# Patient Record
Sex: Male | Born: 1980 | Race: Black or African American | Hispanic: No | Marital: Single | Smoking: Current every day smoker
Health system: Southern US, Community
[De-identification: ages and names within clinical notes are randomized; demographics above are authoritative.]

---

## 2002-06-04 ENCOUNTER — Emergency Department (HOSPITAL_COMMUNITY): Admission: EM | Admit: 2002-06-04 | Discharge: 2002-06-04 | Payer: Self-pay | Admitting: Internal Medicine

## 2003-03-17 ENCOUNTER — Emergency Department (HOSPITAL_COMMUNITY): Admission: EM | Admit: 2003-03-17 | Discharge: 2003-03-17 | Payer: Self-pay | Admitting: Emergency Medicine

## 2003-06-17 ENCOUNTER — Emergency Department (HOSPITAL_COMMUNITY): Admission: EM | Admit: 2003-06-17 | Discharge: 2003-06-17 | Payer: Self-pay | Admitting: Emergency Medicine

## 2006-02-21 ENCOUNTER — Emergency Department (HOSPITAL_COMMUNITY): Admission: EM | Admit: 2006-02-21 | Discharge: 2006-02-21 | Payer: Self-pay | Admitting: Emergency Medicine

## 2010-11-11 ENCOUNTER — Encounter: Payer: Self-pay | Admitting: *Deleted

## 2010-11-11 ENCOUNTER — Inpatient Hospital Stay (HOSPITAL_COMMUNITY): Admission: RE | Admit: 2010-11-11 | Payer: Self-pay | Source: Ambulatory Visit | Admitting: Psychiatry

## 2010-11-11 ENCOUNTER — Emergency Department (HOSPITAL_COMMUNITY)
Admission: EM | Admit: 2010-11-11 | Discharge: 2010-11-11 | Disposition: A | Discharge status: Home / Self Care | Payer: Self-pay | Attending: Emergency Medicine | Admitting: Emergency Medicine

## 2010-11-11 DIAGNOSIS — F3289 Other specified depressive episodes: Secondary | ICD-10-CM | POA: Insufficient documentation

## 2010-11-11 DIAGNOSIS — F10929 Alcohol use, unspecified with intoxication, unspecified: Secondary | ICD-10-CM

## 2010-11-11 DIAGNOSIS — F1010 Alcohol abuse, uncomplicated: Secondary | ICD-10-CM | POA: Insufficient documentation

## 2010-11-11 DIAGNOSIS — F17200 Nicotine dependence, unspecified, uncomplicated: Secondary | ICD-10-CM | POA: Insufficient documentation

## 2010-11-11 DIAGNOSIS — R45851 Suicidal ideations: Secondary | ICD-10-CM | POA: Insufficient documentation

## 2010-11-11 DIAGNOSIS — F329 Major depressive disorder, single episode, unspecified: Secondary | ICD-10-CM | POA: Insufficient documentation

## 2010-11-11 LAB — RAPID URINE DRUG SCREEN, HOSP PERFORMED
Barbiturates: NOT DETECTED
Cocaine: NOT DETECTED

## 2010-11-11 LAB — BASIC METABOLIC PANEL
Calcium: 9.7 mg/dL (ref 8.4–10.5)
Creatinine, Ser: 0.99 mg/dL (ref 0.50–1.35)
GFR calc non Af Amer: >90 mL/min (ref >90)
Glucose, Bld: 66 mg/dL — ABNORMAL LOW (ref 70–99)
Sodium: 142 mEq/L (ref 135–145)

## 2010-11-11 LAB — CBC
MCH: 29.7 pg (ref 26.0–34.0)
MCHC: 33 g/dL (ref 30.0–36.0)
MCV: 90.2 fL (ref 78.0–100.0)
Platelets: 217 10*3/uL (ref 150–400)

## 2010-11-11 MED ORDER — LORAZEPAM 1 MG PO TABS: 1.0000 mg | ORAL_TABLET | Freq: Three times a day (TID) | ORAL | Status: DC | PRN

## 2010-11-11 MED ORDER — ONDANSETRON HCL 4 MG PO TABS: 4.0000 mg | ORAL_TABLET | Freq: Three times a day (TID) | ORAL | Status: DC | PRN

## 2010-11-11 MED ORDER — ACETAMINOPHEN 325 MG PO TABS: 650.0000 mg | ORAL_TABLET | ORAL | Status: DC | PRN

## 2010-11-11 MED ORDER — ZOLPIDEM TARTRATE 5 MG PO TABS: 5.0000 mg | ORAL_TABLET | Freq: Every evening | ORAL | Status: DC | PRN

## 2010-11-11 MED ORDER — IBUPROFEN 400 MG PO TABS: 600.0000 mg | ORAL_TABLET | Freq: Three times a day (TID) | ORAL | Status: DC | PRN

## 2010-11-11 NOTE — ED Provider Notes (Signed)
History     CSN: [id number] Arrival date & time: 11/11/2010  4:07 AM  Chief Complaint  Patient presents with  . Psychiatric Evaluation    (Consider location/radiation/quality/duration/timing/severity/associated sxs/prior treatment) The history is provided by the patient.   patient reports history of depression for approximately one year.  His mother died 1 year ago and since then he has continued to be sad and tearful at times.  He presents to the ER today because of increasing suicidal thoughts without a definitive plan.  No history of suicide attempts in the past.  Is not taking any medication for depression.  His father left him at the age of 3 and returned to develop a relationship with him again at age 80.  His father is now dying of liver cancer and is currently living with the patient and has been for approximately 3 months which has exacerbated his symptoms.  He denies drug or alcohol abuse.  He has no plan at this time.  He reports he became very scared when these thoughts became severe today and thus he called 911 because he doesn't want to harm himself.  The symptoms are moderate.  They're not worsened by anything.  Are not relieved by anything.  They're constant   History reviewed. No pertinent past medical history.  History reviewed. No pertinent past surgical history.  History reviewed. No pertinent family history.  History  Substance Use Topics  . Smoking status: Current Everyday Smoker  . Smokeless tobacco: Not on file  . Alcohol Use: Yes      Review of Systems  All other systems reviewed and are negative.    Allergies  Review of patient's allergies indicates no known allergies.  Home Medications  No current outpatient prescriptions on file.  BP 130/88  Pulse 83  Temp(Src) 98.4 F (36.9 C) (Oral)  Resp 20  Ht 6\' 1"  (1.854 m)  Wt 175 lb (79.379 kg)  BMI 23.09 kg/m2  SpO2 99%  Physical Exam  Constitutional: He is oriented to person, place, and time.  He appears well-developed and well-nourished.  HENT:  Head: Normocephalic.  Eyes: EOM are normal.  Neck: Normal range of motion.  Cardiovascular: Normal rate.   Pulmonary/Chest: Effort normal.  Abdominal: Soft.  Musculoskeletal: Normal range of motion.  Neurological: He is alert and oriented to person, place, and time.  Skin: Skin is warm.  Psychiatric:       Tearful with suicidal thoughts    ED Course  Procedures (including critical care time)  Labs Reviewed  BASIC METABOLIC PANEL - Abnormal; Notable for the following:    Glucose, Bld 66 (*)    All other components within normal limits  ETHANOL - Abnormal; Notable for the following:    Alcohol, Ethyl (B) 146 (*)    All other components within normal limits  CBC  URINE RAPID DRUG SCREEN (HOSP PERFORMED)   No results found.   1. Suicidal ideation       MDM  Will contact Behavior[facility] to determine bed availability. Otherwise, if not beds will involve [facility] team for placement  6:50 AM Spoke with [facility] team who will evaluate the pt in the ER. Will hand off his care to the oncoming physician in 10 minutes   Lyanne Co, MD 11/11/10 [phone]
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
## 2010-11-11 NOTE — ED Notes (Signed)
Return call from [place] from [facility] after using cell phone and states she should be here in approx 15 min to evaluate pt. Pt awoke and breakfast tray given. Warm blanket given. Cooperative and pleasant at this time. Sig other at bedside.

## 2010-11-11 NOTE — ED Notes (Addendum)
Pt c/o being stressed out. Pt states his mother passed away last year. Pt states his dad is very sick (tumor on liver) and he takes care of him. Pt states he is suicidal and when asked if he had a plan, he said "no" if he did he would have already "executed it" and we would find him in the morgue. Pt tearful in triage talking about his mother and how much he misses her and how his dad is giving up on him.

## 2010-11-11 NOTE — ED Provider Notes (Signed)
Pt states he is not suicidal, he states he needs to go home because his father is dying from cancer and is going to have surgery in 2 days. Dr Trisha Mangle, telepsych has interviewed patient and agrees patient is safe to be discharged home without medications, to encourage him to avoid drinking and using drugs.  Samara Deist, [facility] was here.   Devoria Albe, MD, [facility]   Ward Givens, MD 11/11/10 [phone]

## 2010-11-11 NOTE — ED Notes (Signed)
[facility] called and pt is on waiting list. Stated they are expecting some discharges over the weekend.

## 2010-11-11 NOTE — ED Notes (Signed)
Pt updated

## 2010-11-11 NOTE — ED Notes (Addendum)
UDS not back-called lab, lab stated did not have a urine, pt's urine taken to lab. [facility] in with pt now.

## 2012-12-18 ENCOUNTER — Emergency Department (HOSPITAL_COMMUNITY)
Admission: EM | Admit: 2012-12-18 | Discharge: 2012-12-18 | Disposition: A | Discharge status: Home / Self Care | Payer: Self-pay | Attending: Emergency Medicine | Admitting: Emergency Medicine

## 2012-12-18 ENCOUNTER — Encounter (HOSPITAL_COMMUNITY): Payer: Self-pay | Admitting: Emergency Medicine

## 2012-12-18 ENCOUNTER — Emergency Department (HOSPITAL_COMMUNITY): Payer: Self-pay

## 2012-12-18 DIAGNOSIS — S62319A Displaced fracture of base of unspecified metacarpal bone, initial encounter for closed fracture: Secondary | ICD-10-CM | POA: Insufficient documentation

## 2012-12-18 DIAGNOSIS — Y929 Unspecified place or not applicable: Secondary | ICD-10-CM | POA: Insufficient documentation

## 2012-12-18 DIAGNOSIS — X500XXA Overexertion from strenuous movement or load, initial encounter: Secondary | ICD-10-CM | POA: Insufficient documentation

## 2012-12-18 DIAGNOSIS — Y9389 Activity, other specified: Secondary | ICD-10-CM | POA: Insufficient documentation

## 2012-12-18 DIAGNOSIS — H209 Unspecified iridocyclitis: Secondary | ICD-10-CM | POA: Insufficient documentation

## 2012-12-18 DIAGNOSIS — S62307A Unspecified fracture of fifth metacarpal bone, left hand, initial encounter for closed fracture: Secondary | ICD-10-CM

## 2012-12-18 DIAGNOSIS — F17200 Nicotine dependence, unspecified, uncomplicated: Secondary | ICD-10-CM | POA: Insufficient documentation

## 2012-12-18 MED ORDER — CYCLOPENTOLATE HCL 2 % OP SOLN
1.0000 [drp] | Freq: Once | OPHTHALMIC | Status: DC
  Filled 2012-12-18: qty 2

## 2012-12-18 MED ORDER — POLYMYXIN B-TRIMETHOPRIM 10000-0.1 UNIT/ML-% OP SOLN: 1.0000 [drp] | OPHTHALMIC | Status: DC

## 2012-12-18 MED ORDER — IBUPROFEN 600 MG PO TABS: 600.0000 mg | ORAL_TABLET | Freq: Four times a day (QID) | ORAL | Status: DC | PRN

## 2012-12-18 MED ORDER — CYCLOPENTOLATE HCL 1 % OP SOLN
1.0000 [drp] | Freq: Once | OPHTHALMIC | Status: AC
  Administered 2012-12-18: 1 [drp] via OPHTHALMIC
  Filled 2012-12-18: qty 2

## 2012-12-18 MED ORDER — OXYCODONE-ACETAMINOPHEN 5-325 MG PO TABS: 1.0000 | ORAL_TABLET | ORAL | Status: DC | PRN

## 2012-12-18 MED ORDER — IBUPROFEN 400 MG PO TABS
600.0000 mg | ORAL_TABLET | Freq: Once | ORAL | Status: AC
  Administered 2012-12-18: 600 mg via ORAL
  Filled 2012-12-18: qty 2

## 2012-12-18 NOTE — ED Notes (Signed)
Pt alert & oriented x4, stable gait. Patient given discharge instructions, paperwork & prescription(s). Patient  instructed to stop at the registration desk to finish any additional paperwork. Patient verbalized understanding. Pt left department w/ no further questions. 
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
## 2012-12-18 NOTE — ED Notes (Signed)
Pt reports blurred vision in left eye.

## 2012-12-18 NOTE — ED Notes (Signed)
Visual acuity done at this time in triage by [name]. [facility]
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
## 2013-03-20 ENCOUNTER — Emergency Department (HOSPITAL_COMMUNITY): Payer: Self-pay

## 2013-03-20 ENCOUNTER — Encounter (HOSPITAL_COMMUNITY): Payer: Self-pay | Admitting: Emergency Medicine

## 2013-03-20 ENCOUNTER — Emergency Department (HOSPITAL_COMMUNITY)
Admission: EM | Admit: 2013-03-20 | Discharge: 2013-03-20 | Disposition: A | Discharge status: Home / Self Care | Payer: Self-pay | Attending: Emergency Medicine | Admitting: Emergency Medicine

## 2013-03-20 DIAGNOSIS — S71109A Unspecified open wound, unspecified thigh, initial encounter: Secondary | ICD-10-CM | POA: Insufficient documentation

## 2013-03-20 DIAGNOSIS — S62308A Unspecified fracture of other metacarpal bone, initial encounter for closed fracture: Secondary | ICD-10-CM

## 2013-03-20 DIAGNOSIS — S71009A Unspecified open wound, unspecified hip, initial encounter: Secondary | ICD-10-CM | POA: Insufficient documentation

## 2013-03-20 DIAGNOSIS — T07XXXA Unspecified multiple injuries, initial encounter: Secondary | ICD-10-CM

## 2013-03-20 DIAGNOSIS — Z23 Encounter for immunization: Secondary | ICD-10-CM | POA: Insufficient documentation

## 2013-03-20 DIAGNOSIS — F17200 Nicotine dependence, unspecified, uncomplicated: Secondary | ICD-10-CM | POA: Insufficient documentation

## 2013-03-20 DIAGNOSIS — Y939 Activity, unspecified: Secondary | ICD-10-CM | POA: Insufficient documentation

## 2013-03-20 DIAGNOSIS — S8990XA Unspecified injury of unspecified lower leg, initial encounter: Secondary | ICD-10-CM | POA: Insufficient documentation

## 2013-03-20 DIAGNOSIS — IMO0002 Reserved for concepts with insufficient information to code with codable children: Secondary | ICD-10-CM | POA: Insufficient documentation

## 2013-03-20 DIAGNOSIS — X500XXA Overexertion from strenuous movement or load, initial encounter: Secondary | ICD-10-CM | POA: Insufficient documentation

## 2013-03-20 DIAGNOSIS — S99929A Unspecified injury of unspecified foot, initial encounter: Secondary | ICD-10-CM

## 2013-03-20 DIAGNOSIS — Y929 Unspecified place or not applicable: Secondary | ICD-10-CM | POA: Insufficient documentation

## 2013-03-20 DIAGNOSIS — S99919A Unspecified injury of unspecified ankle, initial encounter: Secondary | ICD-10-CM

## 2013-03-20 DIAGNOSIS — S51009A Unspecified open wound of unspecified elbow, initial encounter: Secondary | ICD-10-CM | POA: Insufficient documentation

## 2013-03-20 DIAGNOSIS — S21109A Unspecified open wound of unspecified front wall of thorax without penetration into thoracic cavity, initial encounter: Secondary | ICD-10-CM | POA: Insufficient documentation

## 2013-03-20 MED ORDER — TETANUS-DIPHTH-ACELL PERTUSSIS 5-2.5-18.5 LF-MCG/0.5 IM SUSP
0.5000 mL | Freq: Once | INTRAMUSCULAR | Status: AC
  Administered 2013-03-20: 0.5 mL via INTRAMUSCULAR
  Filled 2013-03-20: qty 0.5

## 2013-03-20 MED ORDER — HYDROCODONE-ACETAMINOPHEN 5-325 MG PO TABS: 1.0000 | ORAL_TABLET | Freq: Four times a day (QID) | ORAL | Status: DC | PRN

## 2013-03-20 MED ORDER — BACITRACIN ZINC 500 UNIT/GM EX OINT
TOPICAL_OINTMENT | CUTANEOUS | Status: AC
  Administered 2013-03-20: 15:00:00
  Filled 2013-03-20: qty 0.9
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
## 2013-11-07 ENCOUNTER — Emergency Department (HOSPITAL_COMMUNITY)
Admission: EM | Admit: 2013-11-07 | Discharge: 2013-11-07 | Disposition: A | Discharge status: Home / Self Care | Payer: Self-pay | Attending: Emergency Medicine | Admitting: Emergency Medicine

## 2013-11-07 ENCOUNTER — Encounter (HOSPITAL_COMMUNITY): Payer: Self-pay | Admitting: Emergency Medicine

## 2013-11-07 DIAGNOSIS — X58XXXA Exposure to other specified factors, initial encounter: Secondary | ICD-10-CM | POA: Insufficient documentation

## 2013-11-07 DIAGNOSIS — Y929 Unspecified place or not applicable: Secondary | ICD-10-CM | POA: Insufficient documentation

## 2013-11-07 DIAGNOSIS — Z792 Long term (current) use of antibiotics: Secondary | ICD-10-CM | POA: Insufficient documentation

## 2013-11-07 DIAGNOSIS — Z720 Tobacco use: Secondary | ICD-10-CM | POA: Insufficient documentation

## 2013-11-07 DIAGNOSIS — Y939 Activity, unspecified: Secondary | ICD-10-CM | POA: Insufficient documentation

## 2013-11-07 DIAGNOSIS — T700XXA Otitic barotrauma, initial encounter: Secondary | ICD-10-CM | POA: Insufficient documentation

## 2013-11-07 MED ORDER — IBUPROFEN 800 MG PO TABS
800.0000 mg | ORAL_TABLET | Freq: Once | ORAL | Status: AC
Start: 2013-11-07 — End: 2013-11-07
  Administered 2013-11-07: 800 mg via ORAL
  Filled 2013-11-07: qty 1

## 2013-11-07 MED ORDER — ANTIPYRINE-BENZOCAINE 5.4-1.4 % OT SOLN
3.0000 [drp] | Freq: Once | OTIC | Status: AC
  Administered 2013-11-07: 4 [drp] via OTIC
  Filled 2013-11-07: qty 10

## 2013-11-07 MED ORDER — IBUPROFEN 600 MG PO TABS: 600.0000 mg | ORAL_TABLET | Freq: Four times a day (QID) | ORAL | Status: DC | PRN

## 2013-11-07 NOTE — Discharge Instructions (Signed)
Ear Barotrauma  Ear barotrauma is ear pain or damage caused by a pressure difference between the inside and outside of the eardrum.  CAUSES   Being too close to an explosion or blast.  Receiving a blow to your ear.  Coming to the surface too rapidly after scuba diving.  Flying in an airplane.  Going to high altitudes within 24 hours after diving.  Undergoing rapid depressurization after working in a pressurized chamber. SYMPTOMS   Dizziness that feels like a spinning, rocking, or tumbling sensation.  Loss of balance.  Nausea.  Ringing in your ear(s) (tinnitus).  Ear pain.  Bleeding from the ear(s).  Sudden partial or complete loss of hearing.  Headache. DIAGNOSIS  Your caregiver may know what is wrong based on your history. Your caregiver may also use blood work, X-rays, and other tests to make sure that other injuries did not happen. This may be more important if your injury happened while scuba diving or if you traveled to high altitude after diving. TREATMENT  Treatment depends on how bad the injury to your ear(s) is. For example, if there is a risk for infection, antibiotic medicine may be recommended. Your caregiver will make these decisions based on what was found during your exam. With severe injuries, you may need to see a specialist. HOME CARE INSTRUCTIONS   Do not travel to high altitudes, work in a pressurized cabin or room, or scuba dive until your caregiver approves.  Keep your ears dry. Use the corner of a towel to get water out of the ears after showering or bathing.  Only take over-the-counter or prescription medicines for pain, discomfort, or fever as directed by your caregiver.  See your caregiver as recommended. SEEK MEDICAL CARE IF:   You have pain that is not relieved by medicine or is getting worse.  You have a fever.  You have any new or unusual discharge from the ear.  The outer ear becomes red or swollen or there is swelling behind your  earlobe.  You have problems that may be related to the medicine you are taking. MAKE SURE YOU:   Understand these instructions.  Will watch your condition.  Will get help right away if you are not doing well or get worse. Document Released: 12/18/2005 Document Revised: 04/09/2011 Document Reviewed: 01/03/2007 [facility] Patient Information 2015 [employer], [place]. This information is not intended to replace advice given to you by your health care provider. Make sure you discuss any questions you have with your health care provider.   You may apply the pain drops in your left ear, then lie with that ear up for 5 minutes to allow it to soak in, 4 drops every 4 hours if needed for pain.

## 2013-11-07 NOTE — ED Notes (Signed)
Left ear pain since yesterday
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
## 2013-11-10 NOTE — ED Provider Notes (Signed)
Medical screening examination/treatment/procedure(s) were performed by non-physician practitioner and as supervising physician I was immediately available for consultation/collaboration.   EKG Interpretation None        Naidelyn Parrella L Lyndsie Wallman, MD 11/10/13 1428 

## 2014-01-08 ENCOUNTER — Encounter (HOSPITAL_COMMUNITY): Payer: Self-pay | Admitting: Oncology

## 2014-01-08 ENCOUNTER — Emergency Department (HOSPITAL_COMMUNITY): Payer: Self-pay

## 2014-01-08 ENCOUNTER — Observation Stay (HOSPITAL_COMMUNITY)
Admission: EM | Admit: 2014-01-08 | Discharge: 2014-01-09 | Disposition: A | Discharge status: Home / Self Care | Payer: Self-pay | Attending: General Surgery | Admitting: General Surgery

## 2014-01-08 DIAGNOSIS — S21112A Laceration without foreign body of left front wall of thorax without penetration into thoracic cavity, initial encounter: Principal | ICD-10-CM | POA: Insufficient documentation

## 2014-01-08 DIAGNOSIS — T797XXA Traumatic subcutaneous emphysema, initial encounter: Secondary | ICD-10-CM | POA: Insufficient documentation

## 2014-01-08 DIAGNOSIS — F10129 Alcohol abuse with intoxication, unspecified: Secondary | ICD-10-CM | POA: Insufficient documentation

## 2014-01-08 DIAGNOSIS — S41012A Laceration without foreign body of left shoulder, initial encounter: Secondary | ICD-10-CM | POA: Insufficient documentation

## 2014-01-08 DIAGNOSIS — Y998 Other external cause status: Secondary | ICD-10-CM | POA: Insufficient documentation

## 2014-01-08 DIAGNOSIS — F17210 Nicotine dependence, cigarettes, uncomplicated: Secondary | ICD-10-CM | POA: Insufficient documentation

## 2014-01-08 DIAGNOSIS — S31119A Laceration without foreign body of abdominal wall, unspecified quadrant without penetration into peritoneal cavity, initial encounter: Secondary | ICD-10-CM

## 2014-01-08 DIAGNOSIS — T148XXA Other injury of unspecified body region, initial encounter: Secondary | ICD-10-CM

## 2014-01-08 DIAGNOSIS — R55 Syncope and collapse: Secondary | ICD-10-CM | POA: Insufficient documentation

## 2014-01-08 DIAGNOSIS — R61 Generalized hyperhidrosis: Secondary | ICD-10-CM | POA: Insufficient documentation

## 2014-01-08 DIAGNOSIS — S21119A Laceration without foreign body of unspecified front wall of thorax without penetration into thoracic cavity, initial encounter: Secondary | ICD-10-CM | POA: Diagnosis present

## 2014-01-08 DIAGNOSIS — Y9389 Activity, other specified: Secondary | ICD-10-CM | POA: Insufficient documentation

## 2014-01-08 DIAGNOSIS — Y9289 Other specified places as the place of occurrence of the external cause: Secondary | ICD-10-CM | POA: Insufficient documentation

## 2014-01-08 LAB — CBC
HCT: 41.2 % (ref 39.0–52.0)
HEMOGLOBIN: 13.2 g/dL (ref 13.0–17.0)
MCH: 28 pg (ref 26.0–34.0)
MCHC: 32 g/dL (ref 30.0–36.0)
MCV: 87.3 fL (ref 78.0–100.0)
PLATELETS: 243 10*3/uL (ref 150–400)
RBC: 4.72 MIL/uL (ref 4.22–5.81)
RDW: 15 % (ref 11.5–15.5)
WBC: 5.2 10*3/uL (ref 4.0–10.5)

## 2014-01-08 LAB — COMPREHENSIVE METABOLIC PANEL
ALK PHOS: 65 U/L (ref 39–117)
ALT: 15 U/L (ref 0–53)
AST: 26 U/L (ref 0–37)
Albumin: 4.7 g/dL (ref 3.5–5.2)
Anion gap: 17 — ABNORMAL HIGH (ref 5–15)
BUN: 6 mg/dL (ref 6–23)
CHLORIDE: 101 meq/L (ref 96–112)
CO2: 25 meq/L (ref 19–32)
Calcium: 9.7 mg/dL (ref 8.4–10.5)
Creatinine, Ser: 1.14 mg/dL (ref 0.50–1.35)
GFR, EST NON AFRICAN AMERICAN: 84 mL/min — AB (ref >90)
Glucose, Bld: 81 mg/dL (ref 70–99)
Potassium: 3.8 mEq/L (ref 3.7–5.3)
SODIUM: 143 meq/L (ref 137–147)
Total Protein: 8.4 g/dL — ABNORMAL HIGH (ref 6.0–8.3)

## 2014-01-08 LAB — PROTIME-INR
INR: 0.96 (ref 0.00–1.49)
Prothrombin Time: 12.9 seconds (ref 11.6–15.2)

## 2014-01-08 LAB — ETHANOL: ALCOHOL ETHYL (B): 278 mg/dL — AB (ref 0–11)

## 2014-01-08 MED ORDER — IOHEXOL 300 MG/ML  SOLN
100.0000 mL | Freq: Once | INTRAMUSCULAR | Status: AC | PRN
  Administered 2014-01-08: 100 mL via INTRAVENOUS

## 2014-01-08 MED ORDER — LIDOCAINE-EPINEPHRINE 1 %-1:100000 IJ SOLN
INTRAMUSCULAR | Status: AC
  Filled 2014-01-08: qty 1

## 2014-01-08 NOTE — ED Notes (Signed)
Pt is A&O x 3 jovial and laughing with staff.  VSS.
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
## 2014-01-08 NOTE — ED Notes (Signed)
Patient transported to CT 

## 2014-01-09 ENCOUNTER — Encounter (HOSPITAL_COMMUNITY)
Admission: EM | Disposition: A | Discharge status: Home / Self Care | Payer: Self-pay | Source: Home / Self Care | Attending: Emergency Medicine

## 2014-01-09 ENCOUNTER — Emergency Department (HOSPITAL_COMMUNITY): Payer: Self-pay | Admitting: Anesthesiology

## 2014-01-09 DIAGNOSIS — S21119A Laceration without foreign body of unspecified front wall of thorax without penetration into thoracic cavity, initial encounter: Secondary | ICD-10-CM | POA: Diagnosis present

## 2014-01-09 HISTORY — PX: INCISION AND DRAINAGE OF WOUND: SHX1803

## 2014-01-09 LAB — CBC
HEMATOCRIT: 32 % — AB (ref 39.0–52.0)
HEMOGLOBIN: 10 g/dL — AB (ref 13.0–17.0)
MCH: 27.2 pg (ref 26.0–34.0)
MCHC: 31.3 g/dL (ref 30.0–36.0)
MCV: 87.2 fL (ref 78.0–100.0)
Platelets: 210 10*3/uL (ref 150–400)
RBC: 3.67 MIL/uL — ABNORMAL LOW (ref 4.22–5.81)
RDW: 15.3 % (ref 11.5–15.5)
WBC: 10.2 10*3/uL (ref 4.0–10.5)

## 2014-01-09 LAB — SAMPLE TO BLOOD BANK

## 2014-01-09 LAB — CDS SEROLOGY

## 2014-01-09 SURGERY — IRRIGATION AND DEBRIDEMENT WOUND
Anesthesia: General | Site: Chest

## 2014-01-09 MED ORDER — HYDROCODONE-ACETAMINOPHEN 5-325 MG PO TABS: 1.0000 | ORAL_TABLET | Freq: Four times a day (QID) | ORAL | Status: DC | PRN

## 2014-01-09 MED ORDER — ONDANSETRON HCL 4 MG/2ML IJ SOLN
4.0000 mg | Freq: Four times a day (QID) | INTRAMUSCULAR | Status: DC | PRN
Start: 2014-01-09 — End: 2014-01-09

## 2014-01-09 MED ORDER — DEXTROSE-NACL 5-0.9 % IV SOLN: INTRAVENOUS | Status: DC

## 2014-01-09 MED ORDER — SODIUM CHLORIDE 0.9 % IR SOLN
Status: DC | PRN
  Administered 2014-01-09: 3000 mL

## 2014-01-09 MED ORDER — HYDROMORPHONE HCL 1 MG/ML IJ SOLN: 1.0000 mg | INTRAMUSCULAR | Status: DC | PRN

## 2014-01-09 MED ORDER — PHENYLEPHRINE HCL 10 MG/ML IJ SOLN
INTRAMUSCULAR | Status: DC | PRN
  Administered 2014-01-09 (×4): 80 ug via INTRAVENOUS

## 2014-01-09 MED ORDER — LACTATED RINGERS IV SOLN
INTRAVENOUS | Status: DC | PRN
  Administered 2014-01-09 (×2): via INTRAVENOUS

## 2014-01-09 MED ORDER — OXYCODONE HCL 5 MG/5ML PO SOLN: 5.0000 mg | Freq: Once | ORAL | Status: DC | PRN

## 2014-01-09 MED ORDER — FENTANYL CITRATE 0.05 MG/ML IJ SOLN
INTRAMUSCULAR | Status: DC | PRN
  Administered 2014-01-09: 100 ug via INTRAVENOUS

## 2014-01-09 MED ORDER — SUCCINYLCHOLINE CHLORIDE 20 MG/ML IJ SOLN
INTRAMUSCULAR | Status: DC | PRN
  Administered 2014-01-09: 120 mg via INTRAVENOUS

## 2014-01-09 MED ORDER — 0.9 % SODIUM CHLORIDE (POUR BTL) OPTIME
TOPICAL | Status: DC | PRN
  Administered 2014-01-09: 1000 mL

## 2014-01-09 MED ORDER — SODIUM CHLORIDE 0.9 % IV SOLN
INTRAVENOUS | Status: DC | PRN
  Administered 2014-01-09: 01:00:00 via INTRAVENOUS

## 2014-01-09 MED ORDER — ONDANSETRON HCL 4 MG PO TABS: 4.0000 mg | ORAL_TABLET | Freq: Four times a day (QID) | ORAL | Status: DC | PRN

## 2014-01-09 MED ORDER — HYDROCODONE-ACETAMINOPHEN 5-325 MG PO TABS
1.0000 | ORAL_TABLET | Freq: Four times a day (QID) | ORAL | Status: DC | PRN
  Administered 2014-01-09 (×2): 2 via ORAL
  Filled 2014-01-09 (×2): qty 2

## 2014-01-09 MED ORDER — HYDROMORPHONE HCL 1 MG/ML IJ SOLN
INTRAMUSCULAR | Status: AC
  Filled 2014-01-09: qty 1

## 2014-01-09 MED ORDER — MIDAZOLAM HCL 2 MG/2ML IJ SOLN
INTRAMUSCULAR | Status: DC | PRN
  Administered 2014-01-09: 2 mg via INTRAVENOUS

## 2014-01-09 MED ORDER — CEFAZOLIN SODIUM-DEXTROSE 2-3 GM-% IV SOLR
INTRAVENOUS | Status: DC | PRN
  Administered 2014-01-09: 2 g via INTRAVENOUS

## 2014-01-09 MED ORDER — TETANUS-DIPHTH-ACELL PERTUSSIS 5-2.5-18.5 LF-MCG/0.5 IM SUSP
0.5000 mL | Freq: Once | INTRAMUSCULAR | Status: AC
  Administered 2014-01-09: 0.5 mL via INTRAMUSCULAR
  Filled 2014-01-09: qty 0.5

## 2014-01-09 MED ORDER — OXYCODONE HCL 5 MG PO TABS: 5.0000 mg | ORAL_TABLET | Freq: Once | ORAL | Status: DC | PRN

## 2014-01-09 MED ORDER — PROPOFOL 10 MG/ML IV BOLUS
INTRAVENOUS | Status: DC | PRN
  Administered 2014-01-09: 150 mg via INTRAVENOUS
  Administered 2014-01-09: 50 mg via INTRAVENOUS

## 2014-01-09 MED ORDER — HYDROMORPHONE HCL 1 MG/ML IJ SOLN
0.2500 mg | INTRAMUSCULAR | Status: DC | PRN
  Administered 2014-01-09 (×2): 0.5 mg via INTRAVENOUS

## 2014-01-09 MED ORDER — LIDOCAINE HCL (CARDIAC) 20 MG/ML IV SOLN
INTRAVENOUS | Status: DC | PRN
  Administered 2014-01-09: 50 mg via INTRAVENOUS

## 2014-01-09 SURGICAL SUPPLY — 36 items
BNDG GAUZE ELAST 4 BULKY (GAUZE/BANDAGES/DRESSINGS) IMPLANT
CANISTER SUCTION 2500CC (MISCELLANEOUS) ×3 IMPLANT
COVER SURGICAL LIGHT HANDLE (MISCELLANEOUS) ×3 IMPLANT
DRAPE LAPAROSCOPIC ABDOMINAL (DRAPES) IMPLANT
DRAPE PED LAPAROTOMY (DRAPES) IMPLANT
DRAPE UTILITY XL STRL (DRAPES) ×12 IMPLANT
DRSG PAD ABDOMINAL 8X10 ST (GAUZE/BANDAGES/DRESSINGS) ×2 IMPLANT
ELECT CAUTERY BLADE 6.4 (BLADE) ×3 IMPLANT
ELECT REM PT RETURN 9FT ADLT (ELECTROSURGICAL) ×3
ELECTRODE REM PT RTRN 9FT ADLT (ELECTROSURGICAL) ×1 IMPLANT
GAUZE SPONGE 4X4 12PLY STRL (GAUZE/BANDAGES/DRESSINGS) IMPLANT
GLOVE BIO SURGEON STRL SZ7.5 (GLOVE) ×3 IMPLANT
GLOVE BIOGEL PI IND STRL 7.0 (GLOVE) IMPLANT
GLOVE BIOGEL PI IND STRL 8 (GLOVE) ×1 IMPLANT
GLOVE BIOGEL PI INDICATOR 7.0 (GLOVE) ×4
GLOVE BIOGEL PI INDICATOR 8 (GLOVE) ×2
GLOVE ECLIPSE 6.5 STRL STRAW (GLOVE) ×2 IMPLANT
GOWN STRL REUS W/ TWL LRG LVL3 (GOWN DISPOSABLE) ×2 IMPLANT
GOWN STRL REUS W/ TWL XL LVL3 (GOWN DISPOSABLE) ×1 IMPLANT
GOWN STRL REUS W/TWL LRG LVL3 (GOWN DISPOSABLE) ×6
GOWN STRL REUS W/TWL XL LVL3 (GOWN DISPOSABLE) ×3
IV NS IRRIG 3000ML ARTHROMATIC (IV SOLUTION) ×2 IMPLANT
KIT BASIN OR (CUSTOM PROCEDURE TRAY) ×3 IMPLANT
KIT ROOM TURNOVER OR (KITS) ×3 IMPLANT
NS IRRIG 1000ML POUR BTL (IV SOLUTION) ×3 IMPLANT
PACK GENERAL/GYN (CUSTOM PROCEDURE TRAY) ×3 IMPLANT
PAD ARMBOARD 7.5X6 YLW CONV (MISCELLANEOUS) ×3 IMPLANT
SPONGE GAUZE 4X4 12PLY STER LF (GAUZE/BANDAGES/DRESSINGS) ×4 IMPLANT
SUT ETHILON 2 0 PSLX (SUTURE) ×8 IMPLANT
SUT VIC AB 2-0 SH 18 (SUTURE) ×6 IMPLANT
SWAB COLLECTION DEVICE MRSA (MISCELLANEOUS) IMPLANT
TAPE CLOTH SURG 6X10 WHT LF (GAUZE/BANDAGES/DRESSINGS) ×4 IMPLANT
TOWEL OR 17X24 6PK STRL BLUE (TOWEL DISPOSABLE) ×3 IMPLANT
TOWEL OR 17X26 10 PK STRL BLUE (TOWEL DISPOSABLE) ×3 IMPLANT
TUBE ANAEROBIC SPECIMEN COL (MISCELLANEOUS) IMPLANT
TUBING CYSTO DISP (UROLOGICAL SUPPLIES) ×2 IMPLANT
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
## 2014-01-09 NOTE — Anesthesia Procedure Notes (Signed)
Procedure Name: Intubation Date/Time: 01/09/2014 1:09 AM Performed by: Molli HazardGORDON, Armiyah Capron M Pre-anesthesia Checklist: Patient identified, Emergency Drugs available, Suction available and Patient being monitored Patient Re-evaluated:Patient Re-evaluated prior to inductionOxygen Delivery Method: Circle system utilized Preoxygenation: Pre-oxygenation with 100% oxygen Intubation Type: IV induction, Rapid sequence and Cricoid Pressure applied Tube type: Oral Tube size: 8.0 mm Number of attempts: 3 Airway Equipment and Method: Stylet and Video-laryngoscopy Placement Confirmation: ETT inserted through vocal cords under direct vision,  positive ETCO2 and breath sounds checked- equal and bilateral Secured at: 25 cm Tube secured with: Tape Dental Injury: Teeth and Oropharynx as per pre-operative assessment  Comments: DL by Lynelle Smoke Ismail Graziani, CRNA; unable to visualize cords. DL by Aleene DavidsonE Fitzgerald, MDA; unable to visualize cords. Glidescope by Dr. Massie MaroonEF, cords visualized and pt intubated without trauma.

## 2014-01-09 NOTE — Discharge Instructions (Signed)

## 2014-01-09 NOTE — Progress Notes (Signed)
Dressings changed. Discharge instructions and prescription given with [employer]back. Discharged ambulatory to girlfriend's care with NT present. Trina Aoarla Jersey Espinoza, RN
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
## 2014-01-09 NOTE — Anesthesia Preprocedure Evaluation (Addendum)
Anesthesia Evaluation  Patient identified by MRN, date of birth, ID band Patient awake    Reviewed: Allergy & Precautions, H&P , NPO status , Patient's Chart, lab work & pertinent test results  Airway Mallampati: II  TM Distance: >3 FB Neck ROM: Full    Dental no notable dental hx. (+) Teeth Intact, Dental Advisory Given   Pulmonary Current Smoker,  breath sounds clear to auscultation  Pulmonary exam normal       Cardiovascular negative cardio ROS  Rhythm:Regular Rate:Normal     Neuro/Psych negative neurological ROS  negative psych ROS   GI/Hepatic negative GI ROS, Neg liver ROS,   Endo/Other  negative endocrine ROS  Renal/GU negative Renal ROS  negative genitourinary   Musculoskeletal   Abdominal   Peds  Hematology negative hematology ROS (+)   Anesthesia Other Findings   Reproductive/Obstetrics negative OB ROS                            Anesthesia Physical Anesthesia Plan  ASA: II and emergent  Anesthesia Plan: General   Post-op Pain Management:    Induction: Rapid sequence, Intravenous and Cricoid pressure planned  Airway Management Planned: Oral ETT  Additional Equipment:   Intra-op Plan:   Post-operative Plan: Extubation in OR  Informed Consent: I have reviewed the patients History and Physical, chart, labs and discussed the procedure including the risks, benefits and alternatives for the proposed anesthesia with the patient or authorized representative who has indicated his/her understanding and acceptance.   Dental advisory given  Plan Discussed with: CRNA  Anesthesia Plan Comments:        Anesthesia Quick Evaluation

## 2014-01-09 NOTE — Anesthesia Postprocedure Evaluation (Signed)
  Anesthesia Post-op Note  Patient: Charles Cochran  Procedure(s) Performed: Procedure(s) with comments: IRRIGATION AND DEBRIDEMENT AND CLOSURE OF MULTIPLE LACERATION TO THE CHEST WALL (N/A) - Left shoulder and arm  Patient Location: PACU  Anesthesia Type:General  Level of Consciousness: awake and alert   Airway and Oxygen Therapy: Patient Spontanous Breathing  Post-op Pain: moderate  Post-op Assessment: Post-op Vital signs reviewed, Patient's Cardiovascular Status Stable and Respiratory Function Stable  Post-op Vital Signs: Reviewed  Filed Vitals:   01/09/14 0527  BP: 105/66  Pulse: 106  Temp: 36.3 C  Resp: 16    Complications: No apparent anesthesia complications

## 2014-01-09 NOTE — Transfer of Care (Signed)
Immediate Anesthesia Transfer of Care Note  Patient: Charles Cochran  Procedure(s) Performed: Procedure(s) with comments: IRRIGATION AND DEBRIDEMENT AND CLOSURE OF MULTIPLE LACERATION TO THE CHEST WALL (N/A) - Left shoulder and arm  Patient Location: PACU  Anesthesia Type:General  Level of Consciousness: sedated and patient cooperative  Airway & Oxygen Therapy: Patient connected to nasal cannula oxygen  Post-op Assessment: Report given to PACU RN, Post -op Vital signs reviewed and stable and Patient moving all extremities X 4  Post vital signs: Reviewed and stable  Complications: No apparent anesthesia complications

## 2014-01-09 NOTE — Progress Notes (Signed)
Utilization Review completed.  

## 2014-01-11 ENCOUNTER — Encounter (HOSPITAL_COMMUNITY): Payer: Self-pay | Admitting: General Surgery
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
## 2017-01-29 ENCOUNTER — Encounter (HOSPITAL_COMMUNITY): Payer: Self-pay | Admitting: Emergency Medicine

## 2019-10-26 ENCOUNTER — Other Ambulatory Visit: Payer: Self-pay

## 2019-10-26 ENCOUNTER — Emergency Department (HOSPITAL_COMMUNITY): Admission: EM | Admit: 2019-10-26 | Discharge: 2019-10-26 | Discharge status: Against Medical Advice | Payer: Self-pay

## 2021-12-09 ENCOUNTER — Other Ambulatory Visit: Payer: Self-pay

## 2021-12-09 DIAGNOSIS — S199XXA Unspecified injury of neck, initial encounter: Secondary | ICD-10-CM | POA: Diagnosis present

## 2021-12-09 DIAGNOSIS — S161XXA Strain of muscle, fascia and tendon at neck level, initial encounter: Secondary | ICD-10-CM | POA: Insufficient documentation

## 2021-12-09 DIAGNOSIS — F17210 Nicotine dependence, cigarettes, uncomplicated: Secondary | ICD-10-CM | POA: Diagnosis not present

## 2021-12-09 DIAGNOSIS — X500XXA Overexertion from strenuous movement or load, initial encounter: Secondary | ICD-10-CM | POA: Diagnosis not present

## 2021-12-09 DIAGNOSIS — Y990 Civilian activity done for income or pay: Secondary | ICD-10-CM | POA: Insufficient documentation

## 2021-12-09 NOTE — ED Triage Notes (Signed)
Pt c/o neck pain x3 weeks. Denies injury. Pain worsens with movement.

## 2021-12-10 ENCOUNTER — Emergency Department (HOSPITAL_BASED_OUTPATIENT_CLINIC_OR_DEPARTMENT_OTHER)
Admission: EM | Admit: 2021-12-10 | Discharge: 2021-12-10 | Disposition: A | Discharge status: Home / Self Care | Payer: BC Managed Care – PPO | Attending: Emergency Medicine | Admitting: Emergency Medicine

## 2021-12-10 ENCOUNTER — Emergency Department (HOSPITAL_BASED_OUTPATIENT_CLINIC_OR_DEPARTMENT_OTHER): Payer: BC Managed Care – PPO | Admitting: Radiology

## 2021-12-10 DIAGNOSIS — S161XXA Strain of muscle, fascia and tendon at neck level, initial encounter: Secondary | ICD-10-CM

## 2021-12-10 MED ORDER — ACETAMINOPHEN 325 MG PO TABS: 650.0000 mg | ORAL_TABLET | Freq: Four times a day (QID) | ORAL | 0 refills | Status: DC | PRN

## 2021-12-10 MED ORDER — IBUPROFEN 800 MG PO TABS
800.0000 mg | ORAL_TABLET | Freq: Once | ORAL | Status: AC
  Administered 2021-12-10: 800 mg via ORAL
  Filled 2021-12-10: qty 1

## 2021-12-10 MED ORDER — HYDROCODONE-ACETAMINOPHEN 5-325 MG PO TABS
1.0000 | ORAL_TABLET | Freq: Once | ORAL | Status: AC
  Administered 2021-12-10: 1 via ORAL
  Filled 2021-12-10: qty 1

## 2021-12-10 MED ORDER — IBUPROFEN 600 MG PO TABS: 600.0000 mg | ORAL_TABLET | Freq: Four times a day (QID) | ORAL | 0 refills | Status: DC | PRN

## 2021-12-10 MED ORDER — OXYCODONE HCL 5 MG PO TABS: 5.0000 mg | ORAL_TABLET | ORAL | 0 refills | Status: DC | PRN

## 2021-12-10 MED ORDER — METHOCARBAMOL 500 MG PO TABS: 1000.0000 mg | ORAL_TABLET | Freq: Two times a day (BID) | ORAL | 0 refills | Status: AC

## 2021-12-10 MED ORDER — LIDOCAINE 5 % EX PTCH
1.0000 | MEDICATED_PATCH | CUTANEOUS | Status: DC
  Administered 2021-12-10: 1 via TRANSDERMAL
  Filled 2021-12-10: qty 1

## 2021-12-10 NOTE — Discharge Instructions (Addendum)
It was a pleasure caring for you today in the emergency department.  Your xray of the next was normal, no signs of a fracture or other abnormality  Pain is likely due to a muscle strain or spasm  Please return to the emergency department for any worsening or worrisome symptoms.
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
## 2021-12-15 ENCOUNTER — Ambulatory Visit: Payer: Self-pay | Admitting: Orthopedic Surgery

## 2022-06-13 ENCOUNTER — Encounter (HOSPITAL_COMMUNITY): Payer: Self-pay | Admitting: *Deleted

## 2022-06-13 ENCOUNTER — Emergency Department (HOSPITAL_COMMUNITY): Payer: Self-pay

## 2022-06-13 ENCOUNTER — Other Ambulatory Visit: Payer: Self-pay

## 2022-06-13 ENCOUNTER — Emergency Department (HOSPITAL_COMMUNITY)
Admission: EM | Admit: 2022-06-13 | Discharge: 2022-06-14 | Disposition: A | Discharge status: Other Acute Inpatient Hospital | Payer: Self-pay | Attending: Emergency Medicine | Admitting: Emergency Medicine

## 2022-06-13 DIAGNOSIS — X838XXA Intentional self-harm by other specified means, initial encounter: Secondary | ICD-10-CM | POA: Insufficient documentation

## 2022-06-13 DIAGNOSIS — R1032 Left lower quadrant pain: Secondary | ICD-10-CM | POA: Insufficient documentation

## 2022-06-13 DIAGNOSIS — F29 Unspecified psychosis not due to a substance or known physiological condition: Secondary | ICD-10-CM | POA: Insufficient documentation

## 2022-06-13 DIAGNOSIS — T40712A Poisoning by cannabis, intentional self-harm, initial encounter: Secondary | ICD-10-CM | POA: Insufficient documentation

## 2022-06-13 DIAGNOSIS — Z20822 Contact with and (suspected) exposure to covid-19: Secondary | ICD-10-CM | POA: Insufficient documentation

## 2022-06-13 DIAGNOSIS — T50902A Poisoning by unspecified drugs, medicaments and biological substances, intentional self-harm, initial encounter: Secondary | ICD-10-CM | POA: Insufficient documentation

## 2022-06-13 DIAGNOSIS — R112 Nausea with vomiting, unspecified: Secondary | ICD-10-CM | POA: Insufficient documentation

## 2022-06-13 DIAGNOSIS — T1491XA Suicide attempt, initial encounter: Secondary | ICD-10-CM

## 2022-06-13 DIAGNOSIS — F329 Major depressive disorder, single episode, unspecified: Secondary | ICD-10-CM | POA: Insufficient documentation

## 2022-06-13 LAB — SALICYLATE LEVEL: Salicylate Lvl: <7 mg/dL — ABNORMAL LOW (ref 7.0–30.0)

## 2022-06-13 LAB — RAPID URINE DRUG SCREEN, HOSP PERFORMED
Amphetamines: NOT DETECTED
Barbiturates: NOT DETECTED
Benzodiazepines: NOT DETECTED
Cocaine: POSITIVE — AB
Opiates: NOT DETECTED
Tetrahydrocannabinol: POSITIVE — AB

## 2022-06-13 LAB — CBC WITH DIFFERENTIAL/PLATELET
Abs Immature Granulocytes: 0.03 10*3/uL (ref 0.00–0.07)
Basophils Absolute: 0 10*3/uL (ref 0.0–0.1)
Basophils Relative: 1 %
Eosinophils Absolute: 0.1 10*3/uL (ref 0.0–0.5)
Eosinophils Relative: 1 %
HCT: 50.7 % (ref 39.0–52.0)
Hemoglobin: 16.9 g/dL (ref 13.0–17.0)
Immature Granulocytes: 0 %
Lymphocytes Relative: 23 %
Lymphs Abs: 1.9 10*3/uL (ref 0.7–4.0)
MCH: 28.6 pg (ref 26.0–34.0)
MCHC: 33.3 g/dL (ref 30.0–36.0)
MCV: 85.9 fL (ref 80.0–100.0)
Monocytes Absolute: 0.6 10*3/uL (ref 0.1–1.0)
Monocytes Relative: 7 %
Neutro Abs: 5.5 10*3/uL (ref 1.7–7.7)
Neutrophils Relative %: 68 %
Platelets: 288 10*3/uL (ref 150–400)
RBC: 5.9 MIL/uL — ABNORMAL HIGH (ref 4.22–5.81)
RDW: 13.8 % (ref 11.5–15.5)
WBC: 8.1 10*3/uL (ref 4.0–10.5)
nRBC: 0 % (ref 0.0–0.2)

## 2022-06-13 LAB — ACETAMINOPHEN LEVEL: Acetaminophen (Tylenol), Serum: <10 ug/mL — ABNORMAL LOW (ref 10–30)

## 2022-06-13 LAB — COMPREHENSIVE METABOLIC PANEL
ALT: 18 U/L (ref 0–44)
AST: 21 U/L (ref 15–41)
Albumin: 4.6 g/dL (ref 3.5–5.0)
Alkaline Phosphatase: 59 U/L (ref 38–126)
Anion gap: 12 (ref 5–15)
BUN: 16 mg/dL (ref 6–20)
CO2: 26 mmol/L (ref 22–32)
Calcium: 9.2 mg/dL (ref 8.9–10.3)
Chloride: 95 mmol/L — ABNORMAL LOW (ref 98–111)
Creatinine, Ser: 1.62 mg/dL — ABNORMAL HIGH (ref 0.61–1.24)
GFR, Estimated: 55 mL/min — ABNORMAL LOW (ref >60)
Glucose, Bld: 99 mg/dL (ref 70–99)
Potassium: 3.9 mmol/L (ref 3.5–5.1)
Sodium: 133 mmol/L — ABNORMAL LOW (ref 135–145)
Total Bilirubin: 1.1 mg/dL (ref 0.3–1.2)
Total Protein: 8.8 g/dL — ABNORMAL HIGH (ref 6.5–8.1)

## 2022-06-13 LAB — URINALYSIS, ROUTINE W REFLEX MICROSCOPIC
Bilirubin Urine: NEGATIVE
Glucose, UA: NEGATIVE mg/dL
Ketones, ur: NEGATIVE mg/dL
Nitrite: NEGATIVE
Protein, ur: 100 mg/dL — AB
Specific Gravity, Urine: 1.005 (ref 1.005–1.030)
pH: 6 (ref 5.0–8.0)

## 2022-06-13 LAB — LIPASE, BLOOD: Lipase: 36 U/L (ref 11–51)

## 2022-06-13 LAB — CBG MONITORING, ED: Glucose-Capillary: 115 mg/dL — ABNORMAL HIGH (ref 70–99)

## 2022-06-13 LAB — ETHANOL: Alcohol, Ethyl (B): <10 mg/dL (ref <10)

## 2022-06-13 MED ORDER — LACTATED RINGERS IV BOLUS
1000.0000 mL | Freq: Once | INTRAVENOUS | Status: AC
  Administered 2022-06-13: 1000 mL via INTRAVENOUS

## 2022-06-13 MED ORDER — ONDANSETRON 4 MG PO TBDP
4.0000 mg | ORAL_TABLET | Freq: Once | ORAL | Status: AC
  Administered 2022-06-13: 4 mg via ORAL
  Filled 2022-06-13: qty 1

## 2022-06-13 MED ORDER — IOHEXOL 300 MG/ML  SOLN
100.0000 mL | Freq: Once | INTRAMUSCULAR | Status: AC | PRN
  Administered 2022-06-13: 100 mL via INTRAVENOUS

## 2022-06-13 NOTE — ED Provider Notes (Signed)
[place] EMERGENCY DEPARTMENT AT [facility] Provider Note   CSN: [id number] Arrival date & time: 06/13/22  1540     History {Add pertinent medical, surgical, social history, OB history to HPI:1} Chief Complaint  Patient presents with  . Abdominal Pain    Charles Cochran is a 42 y.o. male.  HPI     Home Medications Prior to Admission medications   Medication Sig Start Date End Date Taking? Authorizing Provider  acetaminophen (TYLENOL) 325 MG tablet Take 2 tablets (650 mg total) by mouth every 6 (six) hours as needed. 12/10/21   Sloan Leiter, DO  HYDROcodone-acetaminophen (NORCO/VICODIN) 5-325 MG per tablet Take 1-2 tablets by mouth every 6 (six) hours as needed for moderate pain. 01/09/14   Nonie Hoyer, PA-C  ibuprofen (ADVIL) 600 MG tablet Take 1 tablet (600 mg total) by mouth every 6 (six) hours as needed. 12/10/21   Sloan Leiter, DO  ibuprofen (ADVIL,MOTRIN) 600 MG tablet Take 1 tablet (600 mg total) by mouth every 6 (six) hours as needed. 12/18/12   Raeford Razor, MD  ibuprofen (ADVIL,MOTRIN) 600 MG tablet Take 1 tablet (600 mg total) by mouth every 6 (six) hours as needed. 11/07/13   Burgess Amor, PA-C  oxyCODONE (ROXICODONE) 5 MG immediate release tablet Take 1 tablet (5 mg total) by mouth every 4 (four) hours as needed for severe pain. 12/10/21   Sloan Leiter, DO  oxyCODONE-acetaminophen (PERCOCET/ROXICET) 5-325 MG per tablet Take 1-2 tablets by mouth every 4 (four) hours as needed for severe pain. 12/18/12   Raeford Razor, MD  tetrahydrozoline-zinc (VISINE-AC) 0.05-0.25 % ophthalmic solution Place 2 drops into both eyes 3 (three) times daily as needed.    [provider]  trimethoprim-polymyxin b (POLYTRIM) ophthalmic solution Place 1 drop into both eyes every 4 (four) hours. 12/18/12   Raeford Razor, MD      Allergies    Patient has no known allergies.    Review of Systems   Review of Systems  Physical Exam Updated Vital Signs BP (!)  121/95 (BP Location: Right Arm)   Pulse 78   Temp 98.1 F (36.7 C) (Oral)   Resp 18   Ht 6' (1.829 m)   Wt 73.9 kg   SpO2 98%   BMI 22.11 kg/m  Physical Exam  ED Results / Procedures / Treatments   Labs (all labs ordered are listed, but only abnormal results are displayed) Labs Reviewed  CBG MONITORING, ED - Abnormal; Notable for the following components:      Result Value   Glucose-Capillary 115 (*)    All other components within normal limits  COMPREHENSIVE METABOLIC PANEL  ETHANOL  RAPID URINE DRUG SCREEN, HOSP PERFORMED  CBC WITH DIFFERENTIAL/PLATELET  SALICYLATE LEVEL  ACETAMINOPHEN LEVEL  URINALYSIS, ROUTINE W REFLEX MICROSCOPIC  LIPASE, BLOOD    EKG None  Radiology No results found.  Procedures Procedures  {Document cardiac monitor, telemetry assessment procedure when appropriate:1}  Medications Ordered in ED Medications  ondansetron (ZOFRAN-ODT) disintegrating tablet 4 mg (has no administration in time range)    ED Course/ Medical Decision Making/ A&P Clinical Course as of 06/13/22 1823  Wed Jun 13, 2022  1823 Creatinine(!): 1.62 [RP]  1823 COCAINE(!): POSITIVE [RP]  1823 Tetrahydrocannabinol(!): POSITIVE [RP]    Clinical Course User Index [RP] Rondel Baton, MD   {   Click here for ABCD2, HEART and other calculatorsREFRESH Note before signing :1}  Medical Decision Making Amount and/or Complexity of Data Reviewed Labs: ordered. Radiology: ordered.  Risk Prescription drug management.   ***  {Document critical care time when appropriate:1} {Document review of labs and clinical decision tools ie heart score, Chads2Vasc2 etc:1}  {Document your independent review of radiology images, and any outside records:1} {Document your discussion with family members, caretakers, and with consultants:1} {Document social determinants of health affecting pt's care:1} {Document your decision making why or why not admission,  treatments were needed:1} Final Clinical Impression(s) / ED Diagnoses Final diagnoses:  None    Rx / DC Orders ED Discharge Orders     None

## 2022-06-13 NOTE — BH Assessment (Signed)
Charles Cochran is a 41 year old male presenting voluntary to [facility] due to abdomen pain and emesis, which he reports attempted suicide attempt of overdose on Monday, taking 30 500mg tylenol and 10-15 pills of antidepressants. Patient denied that he was currently suicidal, stating "I have only been suicidal for just that one day on Monday".    

## 2022-06-13 NOTE — ED Notes (Addendum)
Pt says he wants to leave- says he is not here for SI or psych issues. Doesn't understand why he is being wanded and asked to dress out and has someone watching him Psychiatrist) Pt says he is here for abd pain and wants to make sure he is ok from taking "those pills the other day," says "he prayed about it and has kids that need him, doesn't want to die, just wants to be evaluated for abd pain," Maryanna Shape, PA made aware of pt saying he wanted to leave- this nurse explained policies of SI assessment puts him at a "high risk" because of his actions 3 days ago-  and that is why he had to be wanded, dressed out, and have a sitter, explained that it was "[facility] policy" and we had to follow these in order to be in compliance with our employer. Pt said he understood and would wait and be patient. Harriet Pho,, PA informed of this as well.

## 2022-06-13 NOTE — ED Triage Notes (Signed)
Pt BIB [facility] for abd pain and emesis.  Reported that pt took 30 500 mg tylenol on Monday and pt not able to state but 10-15 pills of antidepressants.  Abd pain with emesis 3 hours after taking them.  Pt admits that was a suicide attempt, denies SI presently.  Security wanded pt in triage.

## 2022-06-13 NOTE — ED Notes (Signed)
Patient placed personal belongings in bag and changed into burgundy scrubs with yellow non-slip socks. Belongings placed in locker.

## 2022-06-13 NOTE — BH Assessment (Incomplete)
Charles Cochran is a 42 year old male presenting voluntary to [facility] due to abdomen pain and emesis, which he reports attempted suicide attempt of overdose on Monday, taking 30 500mg  tylenol and 10-15 pills of antidepressants. Patient denied that he was currently suicidal, stating "I have only been suicidal for just that one day on Monday".

## 2022-06-14 ENCOUNTER — Encounter (HOSPITAL_COMMUNITY): Payer: Self-pay | Admitting: Family

## 2022-06-14 ENCOUNTER — Other Ambulatory Visit: Payer: Self-pay

## 2022-06-14 ENCOUNTER — Inpatient Hospital Stay (HOSPITAL_COMMUNITY)
Admission: AD | Admit: 2022-06-14 | Discharge: 2022-06-17 | DRG: 885 | Disposition: A | Discharge status: Home / Self Care | Payer: No Typology Code available for payment source | Source: Intra-hospital | Attending: Psychiatry | Admitting: Psychiatry

## 2022-06-14 DIAGNOSIS — F332 Major depressive disorder, recurrent severe without psychotic features: Principal | ICD-10-CM | POA: Diagnosis present

## 2022-06-14 DIAGNOSIS — F1010 Alcohol abuse, uncomplicated: Secondary | ICD-10-CM | POA: Diagnosis present

## 2022-06-14 DIAGNOSIS — G4700 Insomnia, unspecified: Secondary | ICD-10-CM | POA: Diagnosis present

## 2022-06-14 DIAGNOSIS — F1510 Other stimulant abuse, uncomplicated: Secondary | ICD-10-CM | POA: Diagnosis present

## 2022-06-14 DIAGNOSIS — F419 Anxiety disorder, unspecified: Secondary | ICD-10-CM | POA: Diagnosis present

## 2022-06-14 DIAGNOSIS — K5900 Constipation, unspecified: Secondary | ICD-10-CM | POA: Diagnosis present

## 2022-06-14 DIAGNOSIS — T43222A Poisoning by selective serotonin reuptake inhibitors, intentional self-harm, initial encounter: Secondary | ICD-10-CM | POA: Diagnosis present

## 2022-06-14 DIAGNOSIS — T391X2A Poisoning by 4-Aminophenol derivatives, intentional self-harm, initial encounter: Secondary | ICD-10-CM | POA: Diagnosis present

## 2022-06-14 DIAGNOSIS — Z20822 Contact with and (suspected) exposure to covid-19: Secondary | ICD-10-CM | POA: Diagnosis present

## 2022-06-14 DIAGNOSIS — F1090 Alcohol use, unspecified, uncomplicated: Secondary | ICD-10-CM | POA: Insufficient documentation

## 2022-06-14 DIAGNOSIS — F1590 Other stimulant use, unspecified, uncomplicated: Secondary | ICD-10-CM | POA: Insufficient documentation

## 2022-06-14 DIAGNOSIS — F17210 Nicotine dependence, cigarettes, uncomplicated: Secondary | ICD-10-CM | POA: Diagnosis present

## 2022-06-14 DIAGNOSIS — Z5982 Transportation insecurity: Secondary | ICD-10-CM

## 2022-06-14 DIAGNOSIS — K30 Functional dyspepsia: Secondary | ICD-10-CM | POA: Diagnosis present

## 2022-06-14 DIAGNOSIS — T50902A Poisoning by unspecified drugs, medicaments and biological substances, intentional self-harm, initial encounter: Secondary | ICD-10-CM | POA: Insufficient documentation

## 2022-06-14 DIAGNOSIS — Z5941 Food insecurity: Secondary | ICD-10-CM

## 2022-06-14 LAB — RESP PANEL BY RT-PCR (RSV, FLU A&B, COVID)  RVPGX2
Influenza A by PCR: NEGATIVE
Influenza B by PCR: NEGATIVE
Resp Syncytial Virus by PCR: NEGATIVE
SARS Coronavirus 2 by RT PCR: NEGATIVE

## 2022-06-14 MED ORDER — DIPHENHYDRAMINE HCL 25 MG PO CAPS: 50.0000 mg | ORAL_CAPSULE | Freq: Three times a day (TID) | ORAL | Status: DC | PRN

## 2022-06-14 MED ORDER — ACETAMINOPHEN 325 MG PO TABS: 650.0000 mg | ORAL_TABLET | Freq: Four times a day (QID) | ORAL | Status: DC | PRN

## 2022-06-14 MED ORDER — NICOTINE 14 MG/24HR TD PT24
14.0000 mg | MEDICATED_PATCH | Freq: Every day | TRANSDERMAL | Status: DC
  Administered 2022-06-16: 14 mg via TRANSDERMAL
  Filled 2022-06-14 (×6): qty 1

## 2022-06-14 MED ORDER — HYDROXYZINE HCL 25 MG PO TABS
25.0000 mg | ORAL_TABLET | Freq: Three times a day (TID) | ORAL | Status: DC | PRN
  Administered 2022-06-17: 25 mg via ORAL
  Filled 2022-06-14: qty 1
  Filled 2022-06-14: qty 15
  Filled 2022-06-14: qty 1

## 2022-06-14 MED ORDER — DIPHENHYDRAMINE HCL 50 MG/ML IJ SOLN
50.0000 mg | Freq: Three times a day (TID) | INTRAMUSCULAR | Status: DC | PRN
  Filled 2022-06-14: qty 1

## 2022-06-14 MED ORDER — MAGNESIUM HYDROXIDE 400 MG/5ML PO SUSP
30.0000 mL | Freq: Every day | ORAL | Status: DC | PRN
  Administered 2022-06-14: 30 mL via ORAL
  Filled 2022-06-14: qty 30

## 2022-06-14 MED ORDER — HALOPERIDOL 5 MG PO TABS: 5.0000 mg | ORAL_TABLET | Freq: Three times a day (TID) | ORAL | Status: DC | PRN

## 2022-06-14 MED ORDER — HALOPERIDOL LACTATE 5 MG/ML IJ SOLN
5.0000 mg | Freq: Three times a day (TID) | INTRAMUSCULAR | Status: DC | PRN
  Filled 2022-06-14: qty 1

## 2022-06-14 MED ORDER — LORAZEPAM 2 MG/ML IJ SOLN
2.0000 mg | Freq: Three times a day (TID) | INTRAMUSCULAR | Status: DC | PRN
  Filled 2022-06-14: qty 1

## 2022-06-14 MED ORDER — TRAZODONE HCL 50 MG PO TABS
50.0000 mg | ORAL_TABLET | Freq: Every evening | ORAL | Status: DC | PRN
  Administered 2022-06-15 – 2022-06-16 (×2): 50 mg via ORAL
  Filled 2022-06-14 (×3): qty 1
  Filled 2022-06-14: qty 7

## 2022-06-14 MED ORDER — ALUM & MAG HYDROXIDE-SIMETH 200-200-20 MG/5ML PO SUSP: 30.0000 mL | ORAL | Status: DC | PRN

## 2022-06-14 MED ORDER — LORAZEPAM 1 MG PO TABS: 2.0000 mg | ORAL_TABLET | Freq: Three times a day (TID) | ORAL | Status: DC | PRN

## 2022-06-14 NOTE — Progress Notes (Signed)
Pt was accepted to [facility] TODAY 06/14/2022. Bed assignment: 407-1  Pt meets inpatient criteria per Doran Heater, NP  Attending Physician will be Phineas Inches, MD  Report can be called to: - Adult unit: [id number]  Pt can arrive after 2 PM  Care Team Notified: [facility], RN, Doran Heater, NP, Wandra Mannan, Paramedic, and Rosanna Randy, RN  [place], [place]  06/14/2022 11:48 AM

## 2022-06-14 NOTE — ED Notes (Signed)
TTS at this time. 

## 2022-06-14 NOTE — Progress Notes (Signed)
Patient reports constipation, and stated his last BM was on Sunday. PRN MOM administered per [facility], will continue to monitor.

## 2022-06-14 NOTE — [facility] Group Notes (Signed)
[facility] Group Notes:  (Nursing/MHT/Case Management/Adjunct)  Date:  06/14/2022  Time:  9:17 PM  Type of Therapy:   Wrap-up group  Participation Level:  Active  Participation Quality:  Appropriate  Affect:  Appropriate  Cognitive:  Appropriate  Insight:  Appropriate  Engagement in Group:  Engaged  Modes of Intervention:  Education  Summary of Progress/Problems: Goal to get out of bed, participate in treatment. Day 4/10.  Charles Cochran 06/14/2022, 9:17 PM
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
## 2022-06-14 NOTE — Consult Note (Signed)
Telepsych Consultation   Reason for Consult:  Suicide Attempt Referring Physician:  Dr Estell Harpin [facility] Location of Provider: Behavioral Health TTS Department  Patient Identification: [facility] MRN:  [id number] Principal Diagnosis: Intentional overdose [facility]) Diagnosis:  Principal Problem:   Intentional overdose (HCC)   Total Time spent with patient: 30 minutes  Subjective:   [facility] is a 42 y.o. male patient admitted complaining of abdominal pain in the setting of ingesting an intentional overdose.  Patient reports he ingested #30 Tylenol tablets on Monday 06/11/2022 and attempt into his life.  Patient states "I cannot do what I am supposed to do as a man because I cannot get an opportunity for a job.  I want to be a man and take care of my kids."  Patient is insightful regarding situation prior to arrival.  Patient states "I made a mistake, I made a bad decision.  I have been very stressed."  HPI: Patient is reassessed by this nurse practitioner virtually, via telepsychiatry monitor.  He is reclined on hospital stretcher, no apparent distress.  Chart reviewed and patient discussed with Dr. Nelly Rout on 06/14/2022.  Patient is alert and oriented, pleasant and cooperative during assessment.  He presents with depressed mood, congruent affect.  Patient reports recent stressors include the loss of his job 2 months ago.  He reports he has a criminal record that has caused challenges with securing a new job.  He is also behind on child support and believes he will be placed in jail in the coming days and weeks.  [name] is not linked with outpatient psychiatry.  No current medications.  He endorses 1 previous inpatient psychiatric hospitalization in 2002.  Patient reports he was briefly admitted to [facility] after an unidentified person told his mother that he drank bleach.  Patient reports he did not drink bleach and was therefore  discharged quickly.  No family psychiatric or addiction history reported.  Patient denies homicidal ideations. Denies history of suicide attempts, prior to current event, denies history of non suicidal self-harm behavior.   Patient has normal speech and behavior.  He  denies auditory and visual hallucinations.  Patient is able to converse coherently with goal-directed thoughts and no distractibility or preoccupation.  Denies symptoms of paranoia.  Objectively there is no evidence of psychosis/mania or delusional thinking.  Sade resides in [place] with girlfriend, one-year-old child and girlfriend's grandmother.  He denies access to weapons.  He is not currently employed.  He endorses marijuana use, typically uses marijuana 3 times a week.  Most recent marijuana use 1 week ago.  He endorses alcohol use.  Typically consumes two 24 ounce beers daily.  Most recent alcohol use 3 days ago.  He endorses average sleep and appetite.  Patient offered support and encouragement.  He agrees with plan for inpatient psychiatric hospitalization.  Past Psychiatric History: None reported  Risk to Self:  Denies currently Risk to Others:   Denies Prior Inpatient Therapy:   1 previous inpatient admission, approximately 20 years ago. Prior Outpatient Therapy:  Denies  Past Medical History: History reviewed. No pertinent past medical history.  Past Surgical History:  Procedure Laterality Date   INCISION AND DRAINAGE OF WOUND N/A 01/09/2014   Procedure: IRRIGATION AND DEBRIDEMENT AND CLOSURE OF MULTIPLE LACERATION TO THE CHEST WALL;  Surgeon: Axel Filler, MD;  Location: [facility] OR;  Service: General;  Laterality: N/A;  Left shoulder and arm   Family History:  History reviewed. No pertinent family history. Family Psychiatric  History: None reported Social History:  Social History   Substance and Sexual Activity  Alcohol Use Yes   Comment: occ     Social History   Substance and Sexual Activity  Drug Use  Yes   Types: Marijuana    Social History   Socioeconomic History   Marital status: Single    Spouse name: Not on file   Number of children: Not on file   Years of education: Not on file   Highest education level: Not on file  Occupational History   Not on file  Tobacco Use   Smoking status: Every Day    Packs/day: .5    Types: Cigarettes   Smokeless tobacco: Not on file  Substance and Sexual Activity   Alcohol use: Yes    Comment: occ   Drug use: Yes    Types: Marijuana   Sexual activity: Not on file  Other Topics Concern   Not on file  Social History Narrative   ** Merged History Encounter **       Social Determinants of Health   Financial Resource Strain: Not on file  Food Insecurity: Not on file  Transportation Needs: Not on file  Physical Activity: Not on file  Stress: Not on file  Social Connections: Not on file   Additional Social History:    Allergies:  No Known Allergies  Labs:  Results for orders placed or performed during the hospital encounter of 06/13/22 (from the past 48 hour(s))  CBG monitoring, ED     Status: Abnormal   Collection Time: 06/13/22  5:04 PM  Result Value Ref Range   Glucose-Capillary 115 (H) 70 - 99 mg/dL    Comment: Glucose reference range applies only to samples taken after fasting for at least 8 hours.  Urine rapid drug screen (hosp performed)     Status: Abnormal   Collection Time: 06/13/22  5:18 PM  Result Value Ref Range   Opiates NONE DETECTED NONE DETECTED   Cocaine POSITIVE (A) NONE DETECTED   Benzodiazepines NONE DETECTED NONE DETECTED   Amphetamines NONE DETECTED NONE DETECTED   Tetrahydrocannabinol POSITIVE (A) NONE DETECTED   Barbiturates NONE DETECTED NONE DETECTED    Comment: (NOTE) DRUG SCREEN FOR MEDICAL PURPOSES ONLY.  IF CONFIRMATION IS NEEDED FOR ANY PURPOSE, NOTIFY LAB WITHIN 5 DAYS.  LOWEST DETECTABLE LIMITS FOR URINE DRUG SCREEN Drug Class                     Cutoff (ng/mL) Amphetamine and  metabolites    1000 Barbiturate and metabolites    200 Benzodiazepine                 200 Opiates and metabolites        300 Cocaine and metabolites        300 THC                            50 Performed at [facility], [address] [place], [place] [address]   Urinalysis, Routine w reflex microscopic -Urine, Clean Catch     Status: Abnormal   Collection Time: 06/13/22  5:18 PM  Result Value Ref Range   Color, Urine YELLOW YELLOW   APPearance CLEAR CLEAR   Specific Gravity, Urine 1.005 1.005 - 1.030   pH 6.0 5.0 - 8.0   Glucose, UA NEGATIVE NEGATIVE mg/dL   Hgb  urine dipstick SMALL (A) NEGATIVE   Bilirubin Urine NEGATIVE NEGATIVE   Ketones, ur NEGATIVE NEGATIVE mg/dL   Protein, ur 147 (A) NEGATIVE mg/dL   Nitrite NEGATIVE NEGATIVE   Leukocytes,Ua TRACE (A) NEGATIVE   RBC / HPF 0-5 0 - 5 RBC/hpf   WBC, UA 6-10 0 - 5 WBC/hpf   Bacteria, UA RARE (A) NONE SEEN   Squamous Epithelial / HPF 0-5 0 - 5 /HPF   Mucus PRESENT     Comment: Performed at [facility], [address] [place], [place] [address]  Comprehensive metabolic panel     Status: Abnormal   Collection Time: 06/13/22  5:37 PM  Result Value Ref Range   Sodium 133 (L) 135 - 145 mmol/L   Potassium 3.9 3.5 - 5.1 mmol/L   Chloride 95 (L) 98 - 111 mmol/L   CO2 26 22 - 32 mmol/L   Glucose, Bld 99 70 - 99 mg/dL    Comment: Glucose reference range applies only to samples taken after fasting for at least 8 hours.   BUN 16 6 - 20 mg/dL   Creatinine, Ser 2.13 (H) 0.61 - 1.24 mg/dL   Calcium 9.2 8.9 - 08.6 mg/dL   Total Protein 8.8 (H) 6.5 - 8.1 g/dL   Albumin 4.6 3.5 - 5.0 g/dL   AST 21 15 - 41 U/L   ALT 18 0 - 44 U/L   Alkaline Phosphatase 59 38 - 126 U/L   Total Bilirubin 1.1 0.3 - 1.2 mg/dL   GFR, Estimated 55 (L) >60 mL/min    Comment: (NOTE) Calculated using the CKD-EPI Creatinine Equation (2021)    Anion gap 12 5 - 15    Comment: Performed at [facility], [address] [place], [place] [address]  Ethanol      Status: None   Collection Time: 06/13/22  5:37 PM  Result Value Ref Range   Alcohol, Ethyl (B) <10 <10 mg/dL    Comment: (NOTE) Lowest detectable limit for serum alcohol is 10 mg/dL.  For medical purposes only. Performed at [facility], [address], [place], [place] [address]   CBC with Diff     Status: Abnormal   Collection Time: 06/13/22  5:37 PM  Result Value Ref Range   WBC 8.1 4.0 - 10.5 K/uL   RBC 5.90 (H) 4.22 - 5.81 MIL/uL   Hemoglobin 16.9 13.0 - 17.0 g/dL   HCT 28.4 13.2 - 44.0 %   MCV 85.9 80.0 - 100.0 fL   MCH 28.6 26.0 - 34.0 pg   MCHC 33.3 30.0 - 36.0 g/dL   RDW 10.2 72.5 - 36.6 %   Platelets 288 150 - 400 K/uL   nRBC 0.0 0.0 - 0.2 %   Neutrophils Relative % 68 %   Neutro Abs 5.5 1.7 - 7.7 K/uL   Lymphocytes Relative 23 %   Lymphs Abs 1.9 0.7 - 4.0 K/uL   Monocytes Relative 7 %   Monocytes Absolute 0.6 0.1 - 1.0 K/uL   Eosinophils Relative 1 %   Eosinophils Absolute 0.1 0.0 - 0.5 K/uL   Basophils Relative 1 %   Basophils Absolute 0.0 0.0 - 0.1 K/uL   Immature Granulocytes 0 %   Abs Immature Granulocytes 0.03 0.00 - 0.07 K/uL    Comment: Performed at [facility], [address] [place], [place] [address]  Salicylate level     Status: Abnormal   Collection Time: 06/13/22  5:37 PM  Result Value Ref Range   Salicylate Lvl <  7.0 (L) 7.0 - 30.0 mg/dL    Comment: Performed at [facility], [address] [place], [place] [address]  Acetaminophen level     Status: Abnormal   Collection Time: 06/13/22  5:37 PM  Result Value Ref Range   Acetaminophen (Tylenol), Serum <10 (L) 10 - 30 ug/mL    Comment: (NOTE) Therapeutic concentrations vary significantly. A range of 10-30 ug/mL  may be an effective concentration for many patients. However, some  are best treated at concentrations outside of this range. Acetaminophen concentrations >150 ug/mL at 4 hours after ingestion  and >50 ug/mL at 12 hours after ingestion are often associated with  toxic  reactions.  Performed at [facility], [address] [place], [place] [address]   Lipase, blood     Status: None   Collection Time: 06/13/22  5:37 PM  Result Value Ref Range   Lipase 36 11 - 51 U/L    Comment: Performed at [facility], [address] [place], [place] [address]    Medications:  No current facility-administered medications for this encounter.   Current Outpatient Medications  Medication Sig Dispense Refill   acetaminophen (TYLENOL) 325 MG tablet Take 2 tablets (650 mg total) by mouth every 6 (six) hours as needed. (Patient taking differently: Take 500 mg by mouth every 6 (six) hours as needed for moderate pain.) 36 tablet 0    Musculoskeletal: Strength & Muscle Tone: within normal limits Gait & Station: normal Patient leans: N/A          Psychiatric Specialty Exam:  Presentation  General Appearance: Appropriate for Environment; Casual  Eye Contact:Good  Speech:Clear and Coherent; Normal Rate  Speech Volume:Normal  Handedness:Right   Mood and Affect  Mood:Depressed  Affect:Depressed   Thought Process  Thought Processes:Coherent; Goal Directed; Linear  Descriptions of Associations:Intact  Orientation:Full (Time, Place and Person)  Thought Content:Logical; WDL  History of Schizophrenia/Schizoaffective disorder:No  Duration of Psychotic Symptoms:No data recorded Hallucinations:Hallucinations: None  Ideas of Reference:None  Suicidal Thoughts:Suicidal Thoughts: No  Homicidal Thoughts:Homicidal Thoughts: No   Sensorium  Memory:Immediate Good; Recent Good  Judgment:Fair  Insight:Fair   Executive Functions  Concentration:Good  Attention Span:Good  Recall:Good  Fund of Knowledge:Good  Language:Good   Psychomotor Activity  Psychomotor Activity:Psychomotor Activity: Normal   Assets  Assets:Communication Skills; Desire for Improvement; Housing; Leisure Time; Physical Health; Resilience; Social Support   Sleep   Sleep:Sleep: Fair    Physical Exam: Physical Exam Vitals and nursing note reviewed.  Constitutional:      Appearance: Normal appearance. He is well-developed and normal weight.  HENT:     Head: Normocephalic and atraumatic.     Nose: Nose normal.  Cardiovascular:     Rate and Rhythm: Normal rate.  Pulmonary:     Effort: Pulmonary effort is normal.  Musculoskeletal:        General: Normal range of motion.     Cervical back: Normal range of motion.  Skin:    General: Skin is dry.  Neurological:     General: No focal deficit present.     Mental Status: He is alert and oriented to person, place, and time.  Psychiatric:        Attention and Perception: Attention and perception normal.        Mood and Affect: Affect normal. Mood is depressed.        Speech: Speech normal.        Behavior: Behavior normal. Behavior is cooperative.  Thought Content: Thought content normal.        Cognition and Memory: Cognition normal.    Review of Systems  Constitutional: Negative.   HENT: Negative.    Eyes: Negative.   Respiratory: Negative.    Cardiovascular: Negative.   Gastrointestinal: Negative.   Genitourinary: Negative.   Musculoskeletal: Negative.   Skin: Negative.   Neurological: Negative.   Psychiatric/Behavioral:  Positive for depression and suicidal ideas.    Blood pressure (!) 144/98, pulse 62, temperature 98.3 F (36.8 C), temperature source Oral, resp. rate 18, height 6' (1.829 m), weight 73.9 kg, SpO2 100 %. Body mass index is 22.11 kg/m.  Treatment Plan Summary: Patient currently IVC, inpatient psychiatric treatment recommended.  Patient accepted to [facility] behavioral health for inpatient psychiatric treatment/admission.  Disposition: Recommend psychiatric Inpatient admission when medically cleared. Supportive therapy provided about ongoing stressors.  This service was provided via telemedicine using a 2-way, interactive audio and video technology.  Names of  all persons participating in this telemedicine service and their role in this encounter. Name: Debbe Bales Role: Patient  Name: Doran Heater Role: NP  Name: Dr. Estell Harpin Role: Attending physician  Name: Dr. Nelly Rout Role: Psychiatrist    Lenard Lance, FNP 06/14/2022 11:21 AM

## 2022-06-14 NOTE — ED Notes (Signed)
Pt agreeable to phone call with mother of his children. Phone given to patient.

## 2022-06-14 NOTE — Tx Team (Signed)
Initial Treatment Plan 06/14/2022 6:45 PM [name]:[id number]    PATIENT STRESSORS: Financial difficulties   Legal issue   Substance abuse     PATIENT STRENGTHS: Supportive family/friends  Work skills    PATIENT IDENTIFIED PROBLEMS: Depression  Financial Difficulties  Legal issues                 DISCHARGE CRITERIA:  Improved stabilization in mood, thinking, and/or behavior Verbal commitment to aftercare and medication compliance  PRELIMINARY DISCHARGE PLAN: Return to previous living arrangement  PATIENT/FAMILY INVOLVEMENT: This treatment plan has been presented to and reviewed with the patient, [facility].The patient has been given the opportunity to ask questions and make suggestions.  Roseanne Reno, RN 06/14/2022, 6:45 PM

## 2022-06-14 NOTE — Plan of Care (Signed)
  Problem: Education: Goal: Knowledge of [facility] Education information/materials will improve Outcome: Progressing Goal: Verbalization of understanding the information provided will improve Outcome: Progressing   Problem: Coping: Goal: Ability to verbalize frustrations and anger appropriately will improve Outcome: Progressing   Problem: Safety: Goal: Ability to disclose and discuss suicidal ideas will improve Outcome: Progressing

## 2022-06-14 NOTE — Progress Notes (Signed)
   06/14/22 2045  Psych Admission Type (Psych Patients Only)  Admission Status Involuntary  Psychosocial Assessment  Patient Complaints Worrying  Eye Contact Fair  Facial Expression Sad;Worried  Affect Depressed  Speech Logical/coherent  Interaction Assertive  Motor Activity Other (Comment)  Appearance/Hygiene In scrubs  Behavior Characteristics Cooperative  Mood Pleasant;Anxious  Thought Process  Coherency WDL  Content Blaming self  Delusions None reported or observed  Perception WDL  Hallucination None reported or observed  Judgment Limited  Confusion None  Danger to Self  Current suicidal ideation? Denies  Danger to Others  Danger to Others None reported or observed   Patient denies SI, HI,AVH. Patient rated his anxiety 0/10, depression 0/10, and hopelessness 0/10. Patient verbally contacts for safety, emotional support provided. Safety maintained at Q 15 minute intervals.

## 2022-06-15 ENCOUNTER — Encounter (HOSPITAL_COMMUNITY): Payer: Self-pay

## 2022-06-15 DIAGNOSIS — F1590 Other stimulant use, unspecified, uncomplicated: Secondary | ICD-10-CM | POA: Insufficient documentation

## 2022-06-15 DIAGNOSIS — F1090 Alcohol use, unspecified, uncomplicated: Secondary | ICD-10-CM | POA: Insufficient documentation

## 2022-06-15 MED ORDER — VITAMIN B-1 100 MG PO TABS
100.0000 mg | ORAL_TABLET | Freq: Every day | ORAL | Status: DC
  Administered 2022-06-16 – 2022-06-17 (×2): 100 mg via ORAL
  Filled 2022-06-15 (×3): qty 1

## 2022-06-15 MED ORDER — LOPERAMIDE HCL 2 MG PO CAPS: 2.0000 mg | ORAL_CAPSULE | ORAL | Status: DC | PRN

## 2022-06-15 MED ORDER — LORAZEPAM 1 MG PO TABS: 1.0000 mg | ORAL_TABLET | Freq: Four times a day (QID) | ORAL | Status: DC | PRN

## 2022-06-15 MED ORDER — ONDANSETRON 4 MG PO TBDP: 4.0000 mg | ORAL_TABLET | Freq: Four times a day (QID) | ORAL | Status: DC | PRN

## 2022-06-15 MED ORDER — ADULT MULTIVITAMIN W/MINERALS CH
1.0000 | ORAL_TABLET | Freq: Every day | ORAL | Status: DC
  Administered 2022-06-15 – 2022-06-17 (×3): 1 via ORAL
  Filled 2022-06-15 (×4): qty 1

## 2022-06-15 NOTE — Plan of Care (Signed)
°  Problem: Education: °Goal: Emotional status will improve °Outcome: Progressing °Goal: Mental status will improve °Outcome: Progressing °Goal: Verbalization of understanding the information provided will improve °Outcome: Progressing °  °Problem: Coping: °Goal: Ability to verbalize frustrations and anger appropriately will improve °Outcome: Progressing °  °

## 2022-06-15 NOTE — Group Note (Signed)
Date:  06/15/2022 Time:  9:13 PM  Group Topic/Focus:  Wrap-Up Group:   The focus of this group is to help patients review their daily goal of treatment and discuss progress on daily workbooks.    Participation Level:  Active  Participation Quality:  Appropriate  Affect:  Appropriate  Cognitive:  Appropriate  Insight: Appropriate  Engagement in Group:  Engaged  Modes of Intervention:  Education and Exploration  Additional Comments:  Patient attended and participated in group tonight. He reports that this morning started not so great, but the day turned out well.  Lita Mains [facility] 06/15/2022, 9:13 PM

## 2022-06-15 NOTE — H&P (Signed)
Psychiatric Admission Assessment Adult  Patient Identification: Charles Cochran  MRN:  [id number]  Date of Evaluation:  06/15/2022  Chief Complaint: Suicide attempt by overdose on Fluoxetine & tylenol tablets.  Principal Diagnosis: MDD (major depressive disorder), recurrent severe, without psychosis (HCC)  Diagnosis:  Principal Problem:   MDD (major depressive disorder), recurrent severe, without psychosis (HCC) Active Problems:   Stimulant use disorder   Alcohol use disorder  History of Present Illness: This is the first psychiatric admission/evaluation in this [facility] for this 42 year old AA male with hx of stimulant use disorder, alcohol use disorder & probably major depressive disorder. Admitted to the [facility] from the [facility] with complaint of intentional suicide attempt by overdose on 9-10 tablets of fluoxetine & 8 tablets of extra strength Tylenol. Patient apparently did attempt suicide on post mother's day Sunday night which was 3:00 am Monday morning. Reports indicated that the triggers were stressors from unemployment & child care. After ingestion of those medications, he started having nausea/vomiting & abdominal pain that led him calling 911 & was taken to the [facility] ED by the EMS two days after ingestion of those medicines. After medical evaluation/stabilization, [name] was transferred to the [facility] for further psychiatric evaluation/treatment. A review of his UDS showed positive cocaine & THC. BAL was <10. During this evaluation, [name] reluctantly reports,   "It was around 3:00 am Wednesday night that EMS took me to the [facility] ED. My stomach was cramping. It was on Early Monday morning that I took a whole bottle tylenol tablets about 20 tablets. I was trying to get some attention from my girlfriend. We had just had some misunderstanding about her work schedule & hours. She has been working a lot of overtime hours at her work. She works the 3 pm to 11 pm  shift. We also have a 45 months old son. I told my girlfriend that she should not be working over her scheduled time & try to be home sometimes because the baby & I were missing her. After I said that to her, she replied that she is the only one bringing in some income in the house. She also said to me that she did not want to talk about that now. I got to feeling very frustrated. I said to her, I'm going to take these pills right now to kill myself. I took the pills. Then, I went to the house behind the house & stayed there. Then, my stomach started to hurt. I threw up, some of the pills came out. Then I threw up some green stuff. Then came Tuesday & I was still having stomach pain, nausea & vomiting, Then came Wednesday, I was still throwing up. That was when I called 911. I have never attempted suicide in my life. I'm not depressed. I'm ready to be discharged. I have my birthday coming this Sunday. I have made a reservation to take my girlfriend & her grandmother to the beach for my birthday. I agreed to come to this hospital voluntarily. It was when I got to this hospital that I was told that I was IVC'ed. I was tricked into coming to this hospital. I do not need medications & I'm not going to take any medicines because I do not need them. I'm not feeling suicidal or homicidal. I do not hear voices or see things other people are unable to see or hear. I'm not paranoid". Patient also denies any delusional thoughts. He does  not appear to be responding to any internal stimuli".   Objective: During this evaluation, Charles Cochran presents uncooperative, guarded & argumentative. He argued that he was tricked into coming to this hospital. He says he was upset that someone IVC'ed him to [facility] when he voluntarily agreed to come on his own. He argued that the reason he agreed to come to this hospital was to to be linked to an outpatient psychiatric provider. He states that he is not going to take any medicines as there is  nothing wrong with him. He seems highly upset at this time.  Collateral information obtained via the phone & was provided by patient's girlfriend Aisha Searcy: She reports, "I'm Charles Cochran's girlfriend & he lives with [place]. Two days ago, Zalyn took 9-10 tablets of my antidepressant medicine called fluoxetine & 9-10 tablets of the extra strength tylenol. He took the pills to kill himself. He was drinking a lot of alcohol on that Mother's day Sunday.  Prior to the suicide attempt, Petros also has problem with substance use (alcohol, cocaine & weed). He drinks Malt liquor all day long. He also drinks pure liquor at least twice a week. And when he does that, his mood, attitude & mannerism will change. I will watch Mohamed turn into someone he is not. He probably will need substance abuse treatments. I do know that he smokes weed & uses cocaine. On the side of safety, Della is safe with [place] here. When he gets discharged, he will be returning home to use. I will be picking him up after discharge. I'm not worried about his safety here because he is a felon. He is not allowed or authorized to have or own a gun.  Associated Signs/Symptoms:  Depression Symptoms:  depressed mood, psychomotor agitation, suicidal attempt, anxiety,  (Hypo) Manic Symptoms:  Impulsivity,  Anxiety Symptoms:  Excessive Worry,  Psychotic Symptoms:   Patient currently denies any SIHI, AVH, delusional thoughts or paranoia. He does not appear to be responding to any internal stimuli.  PTSD Symptoms: NA  Total Time spent with patient: 1 hour  Past Psychiatric History: In the year 2000, patient reports was hospitalized at [facility] for ingesting bleach. He has also been to prison x 2. Says was released from prison in 2021.   Is the patient at risk to self? No.  Has the patient been a risk to self in the past 6 months? Yes.    Has the patient been a risk to self within the distant past? Yes.    Is the patient a risk to others? No.   Has the patient been a risk to others in the past 6 months? No.  Has the patient been a risk to others within the distant past? No.   [place] Scale:  Flowsheet Row Ad[facility] (Current) [facility] from 06/13/2022 in [facility] Emergency Department at [facility] ED from 12/10/2021 in [facility] Department at [facility]  C-SSRS RISK CATEGORY No Risk High Risk No Risk       Prior Inpatient Therapy: Yes.   If yes, describe: [facility] in 2000.  Prior Outpatient Therapy: No. If yes, describe: NA   Alcohol Screening: 1. How often do you have a drink containing alcohol?: 2 to 3 times a week 2. How many drinks containing alcohol do you have on a typical day when you are drinking?: 1 or 2 3. How often do you have six or more drinks on one  occasion?: Never AUDIT-C Score: 3 4. How often during the last year have you found that you were not able to stop drinking once you had started?: Never 5. How often during the last year have you failed to do what was normally expected from you because of drinking?: Never 6. How often during the last year have you needed a first drink in the morning to get yourself going after a heavy drinking session?: Never 7. How often during the last year have you had a feeling of guilt of remorse after drinking?: Never 8. How often during the last year have you been unable to remember what happened the night before because you had been drinking?: Never 9. Have you or someone else been injured as a result of your drinking?: No 10. Has a relative or friend or a doctor or another health worker been concerned about your drinking or suggested you cut down?: Yes, during the last year Alcohol Use Disorder Identification Test Final Score (AUDIT): 7 Alcohol Brief Interventions/Follow-up: Alcohol education/Brief advice  Substance Abuse History in the last 12 months:  Yes.    Consequences of Substance  Abuse: Discussed with patient during this admission evaluation. Medical Consequences:  Liver damage, Possible death by overdose Legal Consequences:  Arrests, jail time, Loss of driving privilege. Family Consequences:  Family discord, divorce and or separation.  Previous Psychotropic Medications: No   Psychological Evaluations: No   Past Medical History: History reviewed. No pertinent past medical history.  Past Surgical History:  Procedure Laterality Date   INCISION AND DRAINAGE OF WOUND N/A 01/09/2014   Procedure: IRRIGATION AND DEBRIDEMENT AND CLOSURE OF MULTIPLE LACERATION TO THE CHEST WALL;  Surgeon: Axel Filler, MD;  Location: [facility] OR;  Service: General;  Laterality: N/A;  Left shoulder and arm   Family History: History reviewed. No pertinent family history.  Family Psychiatric  History: Alcohol use disorder: Father.                                                   Tobacco use disorder: Mother. Tobacco Screening:  Social History   Tobacco Use  Smoking Status Every Day   Packs/day: .5   Types: Cigarettes  Smokeless Tobacco Not on file    BH Tobacco Counseling     Are you interested in Tobacco Cessation Medications?  Yes, implement Nicotene Replacement Protocol Counseled patient on smoking cessation:  Yes Reason Tobacco Screening Not Completed: No value filed.       Social History: Single, has 6 children, currently unemployed, lives in [place]. Social History   Substance and Sexual Activity  Alcohol Use Yes   Comment: occ     Social History   Substance and Sexual Activity  Drug Use Yes   Types: Marijuana, Cocaine    Additional Social History:  Allergies:  No Known Allergies Lab Results:  Results for orders placed or performed during the hospital encounter of 06/13/22 (from the past 48 hour(s))  CBG monitoring, ED     Status: Abnormal   Collection Time: 06/13/22  5:04 PM  Result Value Ref Range   Glucose-Capillary 115 (H) 70 - 99 mg/dL    Comment:  Glucose reference range applies only to samples taken after fasting for at least 8 hours.  Urine rapid drug screen (hosp performed)     Status: Abnormal   Collection Time: 06/13/22  5:18 PM  Result Value Ref Range   Opiates NONE DETECTED NONE DETECTED   Cocaine POSITIVE (A) NONE DETECTED   Benzodiazepines NONE DETECTED NONE DETECTED   Amphetamines NONE DETECTED NONE DETECTED   Tetrahydrocannabinol POSITIVE (A) NONE DETECTED   Barbiturates NONE DETECTED NONE DETECTED    Comment: (NOTE) DRUG SCREEN FOR MEDICAL PURPOSES ONLY.  IF CONFIRMATION IS NEEDED FOR ANY PURPOSE, NOTIFY LAB WITHIN 5 DAYS.  LOWEST DETECTABLE LIMITS FOR URINE DRUG SCREEN Drug Class                     Cutoff (ng/mL) Amphetamine and metabolites    1000 Barbiturate and metabolites    200 Benzodiazepine                 200 Opiates and metabolites        300 Cocaine and metabolites        300 THC                            50 Performed at [facility], [address] [place], [place] [address]   Urinalysis, Routine w reflex microscopic -Urine, Clean Catch     Status: Abnormal   Collection Time: 06/13/22  5:18 PM  Result Value Ref Range   Color, Urine YELLOW YELLOW   APPearance CLEAR CLEAR   Specific Gravity, Urine 1.005 1.005 - 1.030   pH 6.0 5.0 - 8.0   Glucose, UA NEGATIVE NEGATIVE mg/dL   Hgb urine dipstick SMALL (A) NEGATIVE   Bilirubin Urine NEGATIVE NEGATIVE   Ketones, ur NEGATIVE NEGATIVE mg/dL   Protein, ur 962 (A) NEGATIVE mg/dL   Nitrite NEGATIVE NEGATIVE   Leukocytes,Ua TRACE (A) NEGATIVE   RBC / HPF 0-5 0 - 5 RBC/hpf   WBC, UA 6-10 0 - 5 WBC/hpf   Bacteria, UA RARE (A) NONE SEEN   Squamous Epithelial / HPF 0-5 0 - 5 /HPF   Mucus PRESENT     Comment: Performed at [facility], [address] [place], [place] [address]  Comprehensive metabolic panel     Status: Abnormal   Collection Time: 06/13/22  5:37 PM  Result Value Ref Range   Sodium 133 (L) 135 - 145 mmol/L   Potassium 3.9 3.5 - 5.1  mmol/L   Chloride 95 (L) 98 - 111 mmol/L   CO2 26 22 - 32 mmol/L   Glucose, Bld 99 70 - 99 mg/dL    Comment: Glucose reference range applies only to samples taken after fasting for at least 8 hours.   BUN 16 6 - 20 mg/dL   Creatinine, Ser 1.32 (H) 0.61 - 1.24 mg/dL   Calcium 9.2 8.9 - 44.0 mg/dL   Total Protein 8.8 (H) 6.5 - 8.1 g/dL   Albumin 4.6 3.5 - 5.0 g/dL   AST 21 15 - 41 U/L   ALT 18 0 - 44 U/L   Alkaline Phosphatase 59 38 - 126 U/L   Total Bilirubin 1.1 0.3 - 1.2 mg/dL   GFR, Estimated 55 (L) >60 mL/min    Comment: (NOTE) Calculated using the CKD-EPI Creatinine Equation (2021)    Anion gap 12 5 - 15    Comment: Performed at [facility], [address], [place], [place] [address]  Ethanol     Status: None   Collection Time: 06/13/22  5:37 PM  Result Value Ref Range   Alcohol, Ethyl (B) <10 <10 mg/dL  Comment: (NOTE) Lowest detectable limit for serum alcohol is 10 mg/dL.  For medical purposes only. Performed at [facility], [address], [place], [place] [address]   CBC with Diff     Status: Abnormal   Collection Time: 06/13/22  5:37 PM  Result Value Ref Range   WBC 8.1 4.0 - 10.5 K/uL   RBC 5.90 (H) 4.22 - 5.81 MIL/uL   Hemoglobin 16.9 13.0 - 17.0 g/dL   HCT 60.4 54.0 - 98.1 %   MCV 85.9 80.0 - 100.0 fL   MCH 28.6 26.0 - 34.0 pg   MCHC 33.3 30.0 - 36.0 g/dL   RDW 19.1 47.8 - 29.5 %   Platelets 288 150 - 400 K/uL   nRBC 0.0 0.0 - 0.2 %   Neutrophils Relative % 68 %   Neutro Abs 5.5 1.7 - 7.7 K/uL   Lymphocytes Relative 23 %   Lymphs Abs 1.9 0.7 - 4.0 K/uL   Monocytes Relative 7 %   Monocytes Absolute 0.6 0.1 - 1.0 K/uL   Eosinophils Relative 1 %   Eosinophils Absolute 0.1 0.0 - 0.5 K/uL   Basophils Relative 1 %   Basophils Absolute 0.0 0.0 - 0.1 K/uL   Immature Granulocytes 0 %   Abs Immature Granulocytes 0.03 0.00 - 0.07 K/uL    Comment: Performed at [facility], [address] [place], [place] [address]  Salicylate level     Status: Abnormal    Collection Time: 06/13/22  5:37 PM  Result Value Ref Range   Salicylate Lvl <7.0 (L) 7.0 - 30.0 mg/dL    Comment: Performed at [facility], [address] [place], [place] [address]  Acetaminophen level     Status: Abnormal   Collection Time: 06/13/22  5:37 PM  Result Value Ref Range   Acetaminophen (Tylenol), Serum <10 (L) 10 - 30 ug/mL    Comment: (NOTE) Therapeutic concentrations vary significantly. A range of 10-30 ug/mL  may be an effective concentration for many patients. However, some  are best treated at concentrations outside of this range. Acetaminophen concentrations >150 ug/mL at 4 hours after ingestion  and >50 ug/mL at 12 hours after ingestion are often associated with  toxic reactions.  Performed at [facility], [address], [place], [place] [address]   Lipase, blood     Status: None   Collection Time: 06/13/22  5:37 PM  Result Value Ref Range   Lipase 36 11 - 51 U/L    Comment: Performed at [facility], [address] [place], [place] [address]  Resp panel by RT-PCR (RSV, Flu A&B, Covid) Anterior Nasal Swab     Status: None   Collection Time: 06/14/22 11:52 AM   Specimen: Anterior Nasal Swab  Result Value Ref Range   SARS Coronavirus 2 by RT PCR NEGATIVE NEGATIVE    Comment: (NOTE) SARS-CoV-2 target nucleic acids are NOT DETECTED.  The SARS-CoV-2 RNA is generally detectable in upper respiratory specimens during the acute phase of infection. The lowest concentration of SARS-CoV-2 viral copies this assay can detect is 138 copies/mL. A negative result does not preclude SARS-Cov-2 infection and should not be used as the sole basis for treatment or other patient management decisions. A negative result may occur with  improper specimen collection/handling, submission of specimen other than nasopharyngeal swab, presence of viral mutation(s) within the areas targeted by this assay, and inadequate number of viral copies(<138 copies/mL). A negative result must be  combined with clinical observations, patient history, and epidemiological  information. The expected result is Negative.  Fact Sheet for Patients:  BloggerCourse.com  Fact Sheet for Healthcare Providers:  SeriousBroker.it  This test is no t yet approved or cleared by the [place] FDA and  has been authorized for detection and/or diagnosis of SARS-CoV-2 by FDA under an Emergency Use Authorization (EUA). This EUA will remain  in effect (meaning this test can be used) for the duration of the COVID-19 declaration under Section 564(b)(1) of the Act, 21 U.S.C.section 360bbb-3(b)(1), unless the authorization is terminated  or revoked sooner.       Influenza A by PCR NEGATIVE NEGATIVE   Influenza B by PCR NEGATIVE NEGATIVE    Comment: (NOTE) The Xpert Xpress SARS-CoV-2/FLU/RSV plus assay is intended as an aid in the diagnosis of influenza from Nasopharyngeal swab specimens and should not be used as a sole basis for treatment. Nasal washings and aspirates are unacceptable for Xpert Xpress SARS-CoV-2/FLU/RSV testing.  Fact Sheet for Patients: BloggerCourse.com  Fact Sheet for Healthcare Providers: SeriousBroker.it  This test is not yet approved or cleared by the [place] FDA and has been authorized for detection and/or diagnosis of SARS-CoV-2 by FDA under an Emergency Use Authorization (EUA). This EUA will remain in effect (meaning this test can be used) for the duration of the COVID-19 declaration under Section 564(b)(1) of the Act, 21 U.S.C. section 360bbb-3(b)(1), unless the authorization is terminated or revoked.     Resp Syncytial Virus by PCR NEGATIVE NEGATIVE    Comment: (NOTE) Fact Sheet for Patients: BloggerCourse.com  Fact Sheet for Healthcare Providers: SeriousBroker.it  This test is not yet approved or cleared  by the [place] FDA and has been authorized for detection and/or diagnosis of SARS-CoV-2 by FDA under an Emergency Use Authorization (EUA). This EUA will remain in effect (meaning this test can be used) for the duration of the COVID-19 declaration under Section 564(b)(1) of the Act, 21 U.S.C. section 360bbb-3(b)(1), unless the authorization is terminated or revoked.  Performed at [facility], [address], [place], [place] [address]    Blood Alcohol level:  Lab Results  Component Value Date   ETH <10 06/13/2022   ETH 278 (H) 01/08/2014   Metabolic Disorder Labs:  No results found for: "HGBA1C", "MPG" No results found for: "PROLACTIN" No results found for: "CHOL", "TRIG", "HDL", "CHOLHDL", "VLDL", "LDLCALC"  Current Medications: Current Facility-Administered Medications  Medication Dose Route Frequency Provider Last Rate Last Admin   acetaminophen (TYLENOL) tablet 650 mg  650 mg Oral Q6H PRN Lenard Lance, FNP       alum & mag hydroxide-simeth (MAALOX/MYLANTA) 200-200-20 MG/5ML suspension 30 mL  30 mL Oral Q4H PRN Lenard Lance, FNP       diphenhydrAMINE (BENADRYL) capsule 50 mg  50 mg Oral TID PRN Lenard Lance, FNP       Or   diphenhydrAMINE (BENADRYL) injection 50 mg  50 mg Intramuscular TID PRN Lenard Lance, FNP       haloperidol (HALDOL) tablet 5 mg  5 mg Oral TID PRN Lenard Lance, FNP       Or   haloperidol lactate (HALDOL) injection 5 mg  5 mg Intramuscular TID PRN Lenard Lance, FNP       hydrOXYzine (ATARAX) tablet 25 mg  25 mg Oral TID PRN Lenard Lance, FNP       loperamide (IMODIUM) capsule 2-4 mg  2-4 mg Oral PRN Massengill, Harrold Donath, MD       LORazepam (ATIVAN) tablet  2 mg  2 mg Oral TID PRN Lenard Lance, FNP       Or   LORazepam (ATIVAN) injection 2 mg  2 mg Intramuscular TID PRN Lenard Lance, FNP       LORazepam (ATIVAN) tablet 1 mg  1 mg Oral Q6H PRN Massengill, Harrold Donath, MD       magnesium hydroxide (MILK OF MAGNESIA) suspension 30 mL  30 mL Oral Daily  PRN Lenard Lance, FNP   30 mL at 06/14/22 1957   multivitamin with minerals tablet 1 tablet  1 tablet Oral Daily Massengill, Harrold Donath, MD       nicotine (NICODERM CQ - dosed in mg/24 hours) patch 14 mg  14 mg Transdermal Daily Massengill, Harrold Donath, MD       ondansetron (ZOFRAN-ODT) disintegrating tablet 4 mg  4 mg Oral Q6H PRN Phineas Inches, MD       Melene Muller ON 06/16/2022] thiamine (Vitamin B-1) tablet 100 mg  100 mg Oral Daily Massengill, Nathan, MD       traZODone (DESYREL) tablet 50 mg  50 mg Oral QHS PRN Lenard Lance, FNP       PTA Medications: Medications Prior to Admission  Medication Sig Dispense Refill Last Dose   acetaminophen (TYLENOL) 325 MG tablet Take 2 tablets (650 mg total) by mouth every 6 (six) hours as needed. (Patient taking differently: Take 500 mg by mouth every 6 (six) hours as needed for moderate pain.) 36 tablet 0    Musculoskeletal: Strength & Muscle Tone: within normal limits Gait & Station: normal Patient leans: N/A  Psychiatric Specialty Exam:  Presentation  General Appearance:  Casual; Fairly Groomed  Eye Contact: Good  Speech: Clear and Coherent (loud, increased rate.)  Speech Volume: Increased  Handedness: Right  Mood and Affect  Mood: Anxious; Dysphoric; Irritable  Affect: Congruent  Thought Process  Thought Processes: Coherent  Duration of Psychotic Symptoms: Greater than 1 week. Past Diagnosis of Schizophrenia or Psychoactive disorder: No  Descriptions of Associations:Intact  Orientation:Full (Time, Place and Person)  Thought Content:Logical  Hallucinations:Hallucinations: None  Ideas of Reference:None  Suicidal Thoughts:Suicidal Thoughts: No  Homicidal Thoughts:Homicidal Thoughts: No  Sensorium  Memory: Immediate Good; Recent Good; Remote Good  Judgment: Poor  Insight: Lacking  Executive Functions  Concentration: Poor  Attention Span: Poor  Recall: Good  Fund of  Knowledge: Fair  Language: Good  Psychomotor Activity  Psychomotor Activity: Psychomotor Activity: Increased  Assets  Assets: Communication Skills; Housing; Resilience; Social Support; Physical Health  Sleep  Sleep: Sleep: Good Number of Hours of Sleep: 7.5  Physical Exam: Physical Exam Vitals and nursing note reviewed.  HENT:     Nose: Nose normal.  Cardiovascular:     Pulses: Normal pulses.     Comments: Elevated blood pressure: 125/101.  Will recheck. Patient is currently in no apparent distress. Pulmonary:     Effort: Pulmonary effort is normal.  Genitourinary:    Comments: Deferred Musculoskeletal:        General: Normal range of motion.     Cervical back: Normal range of motion.  Skin:    General: Skin is warm and dry.  Neurological:     General: No focal deficit present.     Mental Status: He is alert and oriented to person, place, and time.    Review of Systems  Constitutional:  Negative for chills, diaphoresis and fever.  HENT:  Negative for congestion and sore throat.   Respiratory:  Negative for cough, shortness of breath  and wheezing.   Cardiovascular:  Negative for chest pain and palpitations.  Gastrointestinal:  Negative for abdominal pain, constipation, diarrhea, heartburn, nausea and vomiting.  Musculoskeletal:  Negative for joint pain and myalgias.  Skin:  Negative for itching and rash.  Neurological:  Negative for dizziness, tingling, tremors, sensory change, speech change, focal weakness, seizures, loss of consciousness, weakness and headaches.  Endo/Heme/Allergies:        Allergies: NKDA  Psychiatric/Behavioral:  Positive for depression and substance abuse (UDS (+) for cocaine/THC. Reports indicated that patient drinks a lot of liquor & mult-liquor.). Negative for hallucinations, memory loss and suicidal ideas (Hx of suicide attempt by overdose on fluoxetine & tylenol tabs.). The patient is nervous/anxious and has insomnia.    Blood pressure  (!) 125/101, pulse 82, temperature 98.7 F (37.1 C), temperature source Oral, resp. rate 16, height 6' (1.829 m), weight 64 kg, SpO2 99 %. Body mass index is 19.15 kg/m.  Treatment Plan Summary: Daily contact with patient to assess and evaluate symptoms and progress in treatment and Medication management.   Principal/active diagnoses. Major depressive disorder.  Alcohol use disorder.  Stimulant use disorder.   Associated symptoms. Anxiety.  Excessive worry.  Use of substances to cope.  Employment discord.  Plan: (Patient declines to be on any antidepressants at this time). However, we continue with the prn medications for  anxiety & insomnia.   -Hydroxyzine 25 mg po tid prn for anxiety.  -Continue Lorazepam 1 mg po Q 6 hrs prn for CIWA >10 x 72 hrs. -Continue Trazodone 50 mg po prn for insomnia.  Agitation protocols: Cont as recommended;  -Benadryl 50 mg po or IM tid prn. -Haldol 5 mg po or IM tid prn.  -Lorazepam 2 mg po or IM tid prn.   Other PRNS -Continue Tylenol 650 mg every 6 hours PRN for mild pain -Continue Maalox 30 ml Q 4 hrs PRN for indigestion -Continue MOM 30 ml po Q 6 hrs for constipation  Safety and Monitoring: Voluntary admission to inpatient psychiatric unit for safety, stabilization and treatment Daily contact with patient to assess and evaluate symptoms and progress in treatment Patient's case to be discussed in multi-disciplinary team meeting Observation Level : q15 minute checks Vital signs: q12 hours Precautions: Safety  Discharge Planning: Social work and case management to assist with discharge planning and identification of hospital follow-up needs prior to discharge Estimated LOS: 5-7 days Discharge Concerns: Need to establish a safety plan; Medication compliance and effectiveness Discharge Goals: Return home with outpatient referrals for mental health follow-up including medication management/psychotherapy  Observation Level/Precautions:  15  minute checks  Laboratory:   Per ED, current lab results reviewed.  Psychotherapy: Enrolled in the group sessions.  Medications: See [facility]    Consultations: As needed.  Discharge Concerns: Safety, mood stability.  Estimated LOS: 5-7 days.  Other: NA   Physician Treatment Plan for Primary Diagnosis: MDD (major depressive disorder), recurrent severe, without psychosis (HCC)  Long Term Goal(s): Improvement in symptoms so as ready for discharge  Short Term Goals: Ability to identify changes in lifestyle to reduce recurrence of condition will improve, Ability to verbalize feelings will improve, Ability to disclose and discuss suicidal ideas, and Ability to demonstrate self-control will improve  Physician Treatment Plan for Secondary Diagnosis: Principal Problem:   MDD (major depressive disorder), recurrent severe, without psychosis (HCC) Active Problems:   Stimulant use disorder   Alcohol use disorder  Long Term Goal(s): Improvement in symptoms so as ready for discharge  Short  Term Goals: Ability to identify and develop effective coping behaviors will improve, Ability to maintain clinical measurements within normal limits will improve, Compliance with prescribed medications will improve, and Ability to identify triggers associated with substance abuse/mental health issues will improve  I certify that inpatient services furnished can reasonably be expected to improve the patient's condition.    Armandina Stammer, NP, pmhnp, fnpbc. 5/17/20243:28 PM
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
## 2022-06-15 NOTE — Group Note (Unsigned)
Date:  06/15/2022 Time:  10:00 PM  Group Topic/Focus:  Wrap-Up Group:   The focus of this group is to help patients review their daily goal of treatment and discuss progress on daily workbooks.     Participation Level:  {BHH PARTICIPATION LEVEL:22264}  Participation Quality:  {BHH PARTICIPATION QUALITY:22265}  Affect:  {BHH AFFECT:22266}  Cognitive:  {BHH COGNITIVE:22267}  Insight: {BHH Insight2:20797}  Engagement in Group:  {BHH ENGAGEMENT IN GROUP:22268}  Modes of Intervention:  {BHH MODES OF INTERVENTION:22269}  Additional Comments:  ***  Mirissa Lopresti Dacosta 06/15/2022, 10:00 PM  

## 2022-06-15 NOTE — B[facility] Group Notes (Signed)
The focus of this group is to help patients establish daily goals to achieve during treatment and discuss how the patient can incorporate goal setting into their daily lives to aide in recovery.    Scale 8 out 10 Goal work on discharge plan

## 2022-06-15 NOTE — Group Note (Signed)
Recreation Therapy Group Note   Group Topic:Leisure Education  Group Date: 06/15/2022 Start Time: 0935 End Time: 1009 Facilitators: Brixton Schnapp-McCall, LRT,CTRS Location: 300 Hall Dayroom   Goal Area(s) Addresses:  Patient will identify positive leisure and recreation activities.  Patient will identify one positive benefit of participation in leisure activities.   Group Description: Leisure Charades. Patients were divided in to 2 groups for game play. LRT used small strips of paper with 1 listed leisure or recreation activity. Patients took turns randomly drawing a slip of paper and acting out the identified activity without using words or sounds. Points were awarded to the team with the first correct guess. After several rounds of game play, team with the most points were declared the winners.   Affect/Mood: Appropriate   Participation Level: Engaged   Participation Quality: Independent   Behavior: Appropriate   Speech/Thought Process: Focused   Insight: Good   Judgement: Good   Modes of Intervention: Competitive Play   Patient Response to Interventions:  Engaged   Education Outcome:  Acknowledges education   Clinical Observations/Individualized Feedback: Pt attended and participated in group.    Plan: Continue to engage patient in RT group sessions 2-3x/week.   Karrisa Didio-McCall, LRT,CTRS 06/15/2022 12:36 PM
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
## 2022-06-15 NOTE — BH IP Treatment Plan (Signed)
Interdisciplinary Treatment and Diagnostic Plan Update  06/15/2022 Time of Session: 1200 Charles Cochran MRN: [id number]  Principal Diagnosis: MDD (major depressive disorder), recurrent severe, without psychosis (HCC)  Secondary Diagnoses: Principal Problem:   MDD (major depressive disorder), recurrent severe, without psychosis (HCC) Active Problems:   Stimulant use disorder   Alcohol use disorder   Current Medications:  Current Facility-Administered Medications  Medication Dose Route Frequency Provider Last Rate Last Admin   acetaminophen (TYLENOL) tablet 650 mg  650 mg Oral Q6H PRN Lenard Lance, FNP       alum & mag hydroxide-simeth (MAALOX/MYLANTA) 200-200-20 MG/5ML suspension 30 mL  30 mL Oral Q4H PRN Lenard Lance, FNP       diphenhydrAMINE (BENADRYL) capsule 50 mg  50 mg Oral TID PRN Lenard Lance, FNP       Or   diphenhydrAMINE (BENADRYL) injection 50 mg  50 mg Intramuscular TID PRN Lenard Lance, FNP       haloperidol (HALDOL) tablet 5 mg  5 mg Oral TID PRN Lenard Lance, FNP       Or   haloperidol lactate (HALDOL) injection 5 mg  5 mg Intramuscular TID PRN Lenard Lance, FNP       hydrOXYzine (ATARAX) tablet 25 mg  25 mg Oral TID PRN Lenard Lance, FNP       loperamide (IMODIUM) capsule 2-4 mg  2-4 mg Oral PRN Massengill, Harrold Donath, MD       LORazepam (ATIVAN) tablet 2 mg  2 mg Oral TID PRN Lenard Lance, FNP       Or   LORazepam (ATIVAN) injection 2 mg  2 mg Intramuscular TID PRN Lenard Lance, FNP       LORazepam (ATIVAN) tablet 1 mg  1 mg Oral Q6H PRN Massengill, Harrold Donath, MD       magnesium hydroxide (MILK OF MAGNESIA) suspension 30 mL  30 mL Oral Daily PRN Lenard Lance, FNP   30 mL at 06/14/22 1957   multivitamin with minerals tablet 1 tablet  1 tablet Oral Daily Massengill, Harrold Donath, MD       nicotine (NICODERM CQ - dosed in mg/24 hours) patch 14 mg  14 mg Transdermal Daily Massengill, Harrold Donath, MD       ondansetron (ZOFRAN-ODT) disintegrating tablet 4 mg  4 mg Oral Q6H PRN  Phineas Inches, MD       Melene Muller ON 06/16/2022] thiamine (Vitamin B-1) tablet 100 mg  100 mg Oral Daily Massengill, Nathan, MD       traZODone (DESYREL) tablet 50 mg  50 mg Oral QHS PRN Lenard Lance, FNP       PTA Medications: Medications Prior to Admission  Medication Sig Dispense Refill Last Dose   acetaminophen (TYLENOL) 325 MG tablet Take 2 tablets (650 mg total) by mouth every 6 (six) hours as needed. (Patient taking differently: Take 500 mg by mouth every 6 (six) hours as needed for moderate pain.) 36 tablet 0     Patient Stressors: Financial difficulties   Legal issue   Substance abuse    Patient Strengths: Supportive family/friends  Work skills   Treatment Modalities: Medication Management, Group therapy, Case management,  1 to 1 session with clinician, Psychoeducation, Recreational therapy.   Physician Treatment Plan for Primary Diagnosis: MDD (major depressive disorder), recurrent severe, without psychosis (HCC) Long Term Goal(s): Improvement in symptoms so as ready for discharge   Short Term Goals: Ability to identify and develop effective coping behaviors will  improve Ability to maintain clinical measurements within normal limits will improve Compliance with prescribed medications will improve Ability to identify triggers associated with substance abuse/mental health issues will improve Ability to identify changes in lifestyle to reduce recurrence of condition will improve Ability to verbalize feelings will improve Ability to disclose and discuss suicidal ideas Ability to demonstrate self-control will improve  Medication Management: Evaluate patient's response, side effects, and tolerance of medication regimen.  Therapeutic Interventions: 1 to 1 sessions, Unit Group sessions and Medication administration.  Evaluation of Outcomes: Not Met  Physician Treatment Plan for Secondary Diagnosis: Principal Problem:   MDD (major depressive disorder), recurrent severe,  without psychosis (HCC) Active Problems:   Stimulant use disorder   Alcohol use disorder  Long Term Goal(s): Improvement in symptoms so as ready for discharge   Short Term Goals: Ability to identify and develop effective coping behaviors will improve Ability to maintain clinical measurements within normal limits will improve Compliance with prescribed medications will improve Ability to identify triggers associated with substance abuse/mental health issues will improve Ability to identify changes in lifestyle to reduce recurrence of condition will improve Ability to verbalize feelings will improve Ability to disclose and discuss suicidal ideas Ability to demonstrate self-control will improve     Medication Management: Evaluate patient's response, side effects, and tolerance of medication regimen.  Therapeutic Interventions: 1 to 1 sessions, Unit Group sessions and Medication administration.  Evaluation of Outcomes: Not Met   RN Treatment Plan for Primary Diagnosis: MDD (major depressive disorder), recurrent severe, without psychosis (HCC) Long Term Goal(s): Knowledge of disease and therapeutic regimen to maintain health will improve  Short Term Goals:   Medication Management: RN will administer medications as ordered by provider, will assess and evaluate patient's response and provide education to patient for prescribed medication. RN will report any adverse and/or side effects to prescribing provider.  Therapeutic Interventions: 1 on 1 counseling sessions, Psychoeducation, Medication administration, Evaluate responses to treatment, Monitor vital signs and CBGs as ordered, Perform/monitor CIWA, COWS, AIMS and Fall Risk screenings as ordered, Perform wound care treatments as ordered.  Evaluation of Outcomes: Not Met   LCSW Treatment Plan for Primary Diagnosis: MDD (major depressive disorder), recurrent severe, without psychosis (HCC) Long Term Goal(s): Safe transition to  appropriate next level of care at discharge, Engage patient in therapeutic group addressing interpersonal concerns.  Short Term Goals: Engage patient in aftercare planning with referrals and resources, Increase social support, Increase ability to appropriately verbalize feelings, Increase emotional regulation, Facilitate acceptance of mental health diagnosis and concerns, Facilitate patient progression through stages of change regarding substance use diagnoses and concerns, and Identify triggers associated with mental health/substance abuse issues  Therapeutic Interventions: Assess for all discharge needs, 1 to 1 time with Social worker, Explore available resources and support systems, Assess for adequacy in community support network, Educate family and significant other(s) on suicide prevention, Complete Psychosocial Assessment, Interpersonal group therapy.  Evaluation of Outcomes: Not Met   Progress in Treatment: Attending groups: No. Participating in groups: No. Taking medication as prescribed: No. Toleration medication: No. Family/Significant other contact made: No, will contact:    Patient understands diagnosis: No. Discussing patient identified problems/goals with staff: No. Medical problems stabilized or resolved: No. Denies suicidal/homicidal ideation: No. Issues/concerns per patient self-inventory: No. Other: N/A  New problem(s) identified: Yes, Describe:  Agitation  New Short Term/Long Term Goal(s): CSW will continue to follow and assess for appropriate referrals and possible discharge planning.     Patient Goals:  Pt  declined  Discharge Plan or Barriers: medication stabilization, elimination of SI thoughts, development of comprehensive mental wellness plan.    Reason for Continuation of Hospitalization: Aggression Anxiety Depression Homicidal ideation Medication stabilization Withdrawal symptoms  Estimated Length of Stay:3-7 Days  Last 3 [place] Suicide Severity  Risk Score: Flowsheet Row Ad[facility] (Current) [facility] ED from 12/10/2021 in [facility] Emergency Department at [facility]  C-SSRS RISK CATEGORY No Risk High Risk No Risk       Last PHQ 2/9 Scores:     No data to display        detox, medication management for mood stabilization; elimination of SI thoughts; development of comprehensive mental wellness/sobriety plan      Scribe for Treatment Team: Ane Payment, LCSW 06/15/2022 3:45 PM

## 2022-06-15 NOTE — Progress Notes (Signed)
   06/15/22 0610  15 Minute Checks  Location Hallway  Visual Appearance Calm  Behavior Composed  Sleep (Behavioral Health Patients Only)  Calculate sleep? (Click Yes once per 24 hr at 0600 safety check) Yes  Documented sleep last 24 hours 6.75

## 2022-06-16 NOTE — Progress Notes (Signed)
   06/16/22 0546  15 Minute Checks  Location Bedroom  Visual Appearance Calm  Behavior Sleeping  Sleep (Behavioral Health Patients Only)  Calculate sleep? (Click Yes once per 24 hr at 0600 safety check) Yes  Documented sleep last 24 hours 6.5

## 2022-06-16 NOTE — Plan of Care (Signed)
  Problem: Education: Goal: Emotional status will improve Outcome: Progressing Goal: Mental status will improve Outcome: Progressing   Problem: Coping: Goal: Ability to verbalize frustrations and anger appropriately will improve Outcome: Progressing   

## 2022-06-16 NOTE — Progress Notes (Signed)
   06/16/22 2030  Psych Admission Type (Psych Patients Only)  Admission Status Involuntary  Psychosocial Assessment  Patient Complaints None  Eye Contact Fair  Facial Expression Animated  Affect Appropriate to circumstance  Speech Logical/coherent  Interaction Assertive  Motor Activity Other (Comment) (WDL)  Appearance/Hygiene In scrubs  Behavior Characteristics Cooperative;Appropriate to situation  Mood Pleasant  Thought Process  Coherency WDL  Content WDL  Delusions None reported or observed  Perception WDL  Hallucination None reported or observed  Judgment WDL  Confusion None  Danger to Self  Current suicidal ideation? Denies  Agreement Not to Harm Self Yes  Description of Agreement Verbal  Danger to Others  Danger to Others None reported or observed

## 2022-06-16 NOTE — Progress Notes (Signed)
Patient denies SI, HI, and AVH this shift. Patient has had no incident of behavioral dyscontrol. Patient stated he has used this time to meditate and focus on sobriety. Patient has been compliant with medications and attended groups this shift. Patient has been calm and cooperative on the unit.   Assess patient for safety, offer medications as prescribed, engage patient in 1:1 staff talks.   Continue to monitor as planned. Patient able to contract for safety.

## 2022-06-16 NOTE — Progress Notes (Signed)
[facility] MD Progress Note  06/16/2022 3:37 PM Charles Cochran  MRN:  [id number]  Reason for admission:  42 year old AA male with hx of stimulant use disorder, alcohol use disorder & probably major depressive disorder. Admitted to the [facility] from the [facility] with complaint of intentional suicide attempt by overdose on 9-10 tablets of fluoxetine & 8 tablets of extra strength Tylenol. Patient apparently did attempt suicide on post mother's day Sunday night which was 3:00 am Monday morning. Reports indicated that the triggers were stressors from unemployment & child care. After ingestion of those medications, he started having nausea/vomiting & abdominal pain that led him calling 911 & was taken to the [facility] ED by the EMS two days after ingestion of those medicines. After medical evaluation/stabilization, Charles Cochran was transferred to the [facility] for further psychiatric evaluation/treatment.   Daily: [name] is seen in his room. Chart reviewed. The chart findings discussed with the treatment team. He presents alert, oriented & aware of situation. He presents with an improved affect, good eye contact & verbally responsive. He reports, "I'm sorry for all that happened yesterday. I feel so good, relaxed & clear minded today. I cannot believe that I have not smoked cigarettes in over 4 days now, had no alcohol, no weed smoking or snorting cocaine. I feel I can think clearly & reflect on my life & other things. Tomorrow will be the birthday I have celebrated without drugs & alcohol. Since losing my job few months ago, all I have been doing is drink alcohol upon alcohol. I have not felt this good in a long time. I did take some medicine for sleep last night. I slept well. Although I do not want to be on any medicines, I would like to have a follow-up appointment with a psychiatrist after discharge". [name] currently denies any SIHI, AVH, delusional thoughts or paranoia. He does not appear to be responding to  any internal stimuli. A review of patient's vital signs, patient's blood remained is elevated at two separate times it was checked. Will initiate a low dose of antihypertensive.    Principal Problem: MDD (major depressive disorder), recurrent severe, without psychosis (HCC)  Diagnosis: Principal Problem:   MDD (major depressive disorder), recurrent severe, without psychosis (HCC) Active Problems:   Stimulant use disorder   Alcohol use disorder  Total Time spent with patient:  35 minutes.  Past Psychiatric History: See H&P  Past Medical History: History reviewed. No pertinent past medical history.  Past Surgical History:  Procedure Laterality Date   INCISION AND DRAINAGE OF WOUND N/A 01/09/2014   Procedure: IRRIGATION AND DEBRIDEMENT AND CLOSURE OF MULTIPLE LACERATION TO THE CHEST WALL;  Surgeon: Axel Filler, MD;  Location: [facility] OR;  Service: General;  Laterality: N/A;  Left shoulder and arm   Family History: History reviewed. No pertinent family history.  Family Psychiatric  History: See H&P  Social History:  Social History   Substance and Sexual Activity  Alcohol Use Yes   Comment: occ     Social History   Substance and Sexual Activity  Drug Use Yes   Types: Marijuana, Cocaine    Social History   Socioeconomic History   Marital status: Single    Spouse name: Not on file   Number of children: Not on file   Years of education: Not on file   Highest education level: Not on file  Occupational History   Not on file  Tobacco Use   Smoking status: Every  Day    Packs/day: .5    Types: Cigarettes   Smokeless tobacco: Not on file  Substance and Sexual Activity   Alcohol use: Yes    Comment: occ   Drug use: Yes    Types: Marijuana, Cocaine   Sexual activity: Not on file  Other Topics Concern   Not on file  Social History Narrative   ** Merged History Encounter **       Social Determinants of Health   Financial Resource Strain: Not on file  Food Insecurity:  Food Insecurity Present (06/14/2022)   Hunger Vital Sign    Worried About Running Out of Food in the Last Year: Sometimes true    Ran Out of Food in the Last Year: Never true  Transportation Needs: Unmet Transportation Needs (06/14/2022)   PRAPARE - Administrator, Civil Service (Medical): Yes    Lack of Transportation (Non-Medical): Yes  Physical Activity: Not on file  Stress: Not on file  Social Connections: Not on file   Additional Social History:   Sleep: Good  Appetite:  Good  Current Medications: Current Facility-Administered Medications  Medication Dose Route Frequency Provider Last Rate Last Admin   acetaminophen (TYLENOL) tablet 650 mg  650 mg Oral Q6H PRN Lenard Lance, FNP       alum & mag hydroxide-simeth (MAALOX/MYLANTA) 200-200-20 MG/5ML suspension 30 mL  30 mL Oral Q4H PRN Lenard Lance, FNP       diphenhydrAMINE (BENADRYL) capsule 50 mg  50 mg Oral TID PRN Lenard Lance, FNP       Or   diphenhydrAMINE (BENADRYL) injection 50 mg  50 mg Intramuscular TID PRN Lenard Lance, FNP       haloperidol (HALDOL) tablet 5 mg  5 mg Oral TID PRN Lenard Lance, FNP       Or   haloperidol lactate (HALDOL) injection 5 mg  5 mg Intramuscular TID PRN Lenard Lance, FNP       hydrOXYzine (ATARAX) tablet 25 mg  25 mg Oral TID PRN Lenard Lance, FNP       loperamide (IMODIUM) capsule 2-4 mg  2-4 mg Oral PRN Massengill, Harrold Donath, MD       LORazepam (ATIVAN) tablet 2 mg  2 mg Oral TID PRN Lenard Lance, FNP       Or   LORazepam (ATIVAN) injection 2 mg  2 mg Intramuscular TID PRN Lenard Lance, FNP       LORazepam (ATIVAN) tablet 1 mg  1 mg Oral Q6H PRN Massengill, Nathan, MD       magnesium hydroxide (MILK OF MAGNESIA) suspension 30 mL  30 mL Oral Daily PRN Lenard Lance, FNP   30 mL at 06/14/22 1957   multivitamin with minerals tablet 1 tablet  1 tablet Oral Daily Massengill, Nathan, MD   1 tablet at 06/16/22 0715   nicotine (NICODERM CQ - dosed in mg/24 hours) patch 14 mg  14 mg  Transdermal Daily Massengill, Harrold Donath, MD   14 mg at 06/16/22 0715   ondansetron (ZOFRAN-ODT) disintegrating tablet 4 mg  4 mg Oral Q6H PRN Massengill, Harrold Donath, MD       thiamine (Vitamin B-1) tablet 100 mg  100 mg Oral Daily Massengill, Nathan, MD   100 mg at 06/16/22 0715   traZODone (DESYREL) tablet 50 mg  50 mg Oral QHS PRN Lenard Lance, FNP   50 mg at 06/15/22 2200   Lab Results: No results  found for this or any previous visit (from the past 48 hour(s)).  Blood Alcohol level:  Lab Results  Component Value Date   ETH <10 06/13/2022   ETH 278 (H) 01/08/2014   Metabolic Disorder Labs: No results found for: "HGBA1C", "MPG" No results found for: "PROLACTIN" No results found for: "CHOL", "TRIG", "HDL", "CHOLHDL", "VLDL", "LDLCALC"  Physical Findings: AIMS:  , ,  ,  ,    CIWA:    COWS:     Musculoskeletal: Strength & Muscle Tone: within normal limits Gait & Station: normal Patient leans: N/A  Psychiatric Specialty Exam:  Presentation  General Appearance:  Appropriate for Environment; Casual  Eye Contact: Good  Speech: Clear and Coherent; Normal Rate  Speech Volume: Normal  Handedness: Right   Mood and Affect  Mood: -- (Improving)  Affect: Appropriate; Congruent   Thought Process  Thought Processes: Coherent; Goal Directed; Linear  Descriptions of Associations:Intact  Orientation:Full (Time, Place and Person)  Thought Content:Logical  History of Schizophrenia/Schizoaffective disorder:No  Duration of Psychotic Symptoms:No data recorded Hallucinations:Hallucinations: None  Ideas of Reference:None  Suicidal Thoughts:Suicidal Thoughts: No  Homicidal Thoughts:Homicidal Thoughts: No   Sensorium  Memory: Immediate Good; Recent Good; Remote Good  Judgment: Fair  Insight: Fair  Art therapist  Concentration: Good  Attention Span: Good  Recall: Good  Fund of Knowledge: Fair  Language: Good  Psychomotor Activity   Psychomotor Activity: Psychomotor Activity: Normal  Assets  Assets: Communication Skills; Desire for Improvement; Housing; Social Support; Resilience; Physical Health  Sleep  Sleep: Sleep: Good Number of Hours of Sleep: 7.5  Physical Exam: Physical Exam Vitals and nursing note reviewed.  HENT:     Mouth/Throat:     Pharynx: Oropharynx is clear.  Cardiovascular:     Pulses: Normal pulses.     Comments: Elevated blood pressure: 135/100, 125/101, 137/97.  Will initiate an antihypertensive. Pulmonary:     Effort: Pulmonary effort is normal.  Genitourinary:    Comments: Deferred Musculoskeletal:        General: Normal range of motion.     Cervical back: Normal range of motion.  Skin:    General: Skin is warm and dry.  Neurological:     General: No focal deficit present.     Mental Status: He is alert and oriented to person, place, and time.    Review of Systems  Constitutional:  Negative for chills, diaphoresis and fever.  HENT:  Negative for congestion and sore throat.   Respiratory:  Negative for cough, shortness of breath and wheezing.   Cardiovascular:  Negative for chest pain and palpitations.  Gastrointestinal:  Negative for abdominal pain, blood in stool, constipation, diarrhea, heartburn, melena, nausea and vomiting.  Musculoskeletal:  Negative for joint pain and myalgias.  Neurological:  Negative for dizziness, tingling, tremors, sensory change, speech change, focal weakness, seizures, loss of consciousness, weakness and headaches.  Endo/Heme/Allergies:        NKDA  Psychiatric/Behavioral:  Positive for substance abuse (Hx cocaine/THC/ETOH abuse). Negative for depression, hallucinations, memory loss and suicidal ideas (Hx of attempt by overdose.). The patient has insomnia. The patient is not nervous/anxious.    Blood pressure (!) 121/97, pulse 75, temperature 98.1 F (36.7 C), temperature source Oral, resp. rate 16, height 6' (1.829 m), weight 64 kg, SpO2 100 %.  Body mass index is 19.15 kg/m.  Treatment Plan Summary: Daily contact with patient to assess and evaluate symptoms and progress in treatment and Medication management.   Continue inpatient hospitalization.  Will continue today  06/16/2022 plan as below except where it is noted.   Principal/active diagnoses. Major depressive disorder.  Alcohol use disorder.  Stimulant use disorder.    Associated symptoms. Anxiety.  Excessive worry.  Use of substances to cope.  Employment discord.  Plan: (Patient declines to be on any antidepressants at this time). However, we continue with the prn medications for  anxiety & insomnia.    -Hydroxyzine 25 mg po tid prn for anxiety.  -Continue Lorazepam 1 mg po Q 6 hrs prn for CIWA >10 x 72 hrs. -Continue Trazodone 50 mg po prn for insomnia.   Agitation protocols: Cont as recommended;  -Benadryl 50 mg po or IM tid prn. -Haldol 5 mg po or IM tid prn.  -Lorazepam 2 mg po or IM tid prn.  Other PRNS -Continue Tylenol 650 mg every 6 hours PRN for mild pain -Continue Maalox 30 ml Q 4 hrs PRN for indigestion -Continue MOM 30 ml po Q 6 hrs for constipation   Safety and Monitoring: Voluntary admission to inpatient psychiatric unit for safety, stabilization and treatment Daily contact with patient to assess and evaluate symptoms and progress in treatment Patient's case to be discussed in multi-disciplinary team meeting Observation Level : q15 minute checks Vital signs: q12 hours Precautions: Safety   Discharge Planning: Social work and case management to assist with discharge planning and identification of hospital follow-up needs prior to discharge Estimated LOS: 5-7 days Discharge Concerns: Need to establish a safety plan; Medication compliance and effectiveness Discharge Goals: Return home with outpatient referrals for mental health follow-up including medication management/psychotherapy  Armandina Stammer, NP, pmhnp, fnp-bc 06/16/2022, 3:37 PM

## 2022-06-16 NOTE — Group Note (Signed)
Date:  06/16/2022 Time:  8:57 PM  Group Topic/Focus:  Wrap-Up Group:   The focus of this group is to help patients review their daily goal of treatment and discuss progress on daily workbooks.    Participation Level:  Active  Participation Quality:  Appropriate  Affect:  Appropriate  Cognitive:  Appropriate  Insight: Appropriate  Engagement in Group:  Engaged  Modes of Intervention:  Education and Exploration  Additional Comments:  Patient attended and participated in group tonight. He reports that he like that he is good interacting with people. He has morals, he is respectful, love music, a good listener and tray to make someone day better by making them laugh.  Lita Mains [facility] 06/16/2022, 8:57 PM

## 2022-06-17 DIAGNOSIS — F332 Major depressive disorder, recurrent severe without psychotic features: Principal | ICD-10-CM

## 2022-06-17 MED ORDER — LISINOPRIL 10 MG PO TABS
10.0000 mg | ORAL_TABLET | Freq: Once | ORAL | Status: AC
  Administered 2022-06-17: 10 mg via ORAL
  Filled 2022-06-17: qty 1
  Filled 2022-06-17: qty 2

## 2022-06-17 MED ORDER — HYDROXYZINE HCL 25 MG PO TABS: 25.0000 mg | ORAL_TABLET | Freq: Three times a day (TID) | ORAL | 0 refills | Status: AC | PRN

## 2022-06-17 MED ORDER — NICOTINE 14 MG/24HR TD PT24: 14.0000 mg | MEDICATED_PATCH | Freq: Every day | TRANSDERMAL | 0 refills | Status: AC

## 2022-06-17 MED ORDER — LISINOPRIL 10 MG PO TABS: 10.0000 mg | ORAL_TABLET | Freq: Every day | ORAL | 0 refills | Status: DC

## 2022-06-17 MED ORDER — LISINOPRIL 10 MG PO TABS
10.0000 mg | ORAL_TABLET | Freq: Every day | ORAL | Status: DC
  Filled 2022-06-17: qty 7

## 2022-06-17 MED ORDER — LISINOPRIL 10 MG PO TABS: 10.0000 mg | ORAL_TABLET | Freq: Every day | ORAL | 0 refills | Status: AC

## 2022-06-17 MED ORDER — TRAZODONE HCL 50 MG PO TABS: 50.0000 mg | ORAL_TABLET | Freq: Every evening | ORAL | 0 refills | Status: AC | PRN

## 2022-06-17 NOTE — Progress Notes (Signed)
   06/17/22 0549  15 Minute Checks  Location Bedroom  Visual Appearance Calm  Behavior Sleeping  Sleep (Behavioral Health Patients Only)  Calculate sleep? (Click Yes once per 24 hr at 0600 safety check) Yes  Documented sleep last 24 hours 5.75
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
## 2022-06-17 NOTE — Discharge Summary (Signed)
Physician Discharge Summary Note  Patient:  Charles Cochran is an 42 y.o., male  MRN:  [id number]  DOB:  Jul 26, 1980  Patient phone:  [phone] (home)   Patient address:   [address] [place] [address],   Total Time spent with patient:  Greater than 30 minutes  Date of Admission:  06/14/2022  Date of Discharge: 06-17-22  Reason for Admission: Suicide attempt by overdose on tylenol & Fluoxetine tablets.  Principal Problem: MDD (major depressive disorder), recurrent severe, without psychosis (HCC)  Discharge Diagnoses: Principal Problem:   MDD (major depressive disorder), recurrent severe, without psychosis (HCC) Active Problems:   Stimulant use disorder   Alcohol use disorder  Past Psychiatric History: See   Past Medical History: History reviewed. No pertinent past medical history.  Past Surgical History:  Procedure Laterality Date   INCISION AND DRAINAGE OF WOUND N/A 01/09/2014   Procedure: IRRIGATION AND DEBRIDEMENT AND CLOSURE OF MULTIPLE LACERATION TO THE CHEST WALL;  Surgeon: Axel Filler, MD;  Location: [facility] OR;  Service: General;  Laterality: N/A;  Left shoulder and arm   Family History: History reviewed. No pertinent family history.  Family Psychiatric  History: See H&P.  Social History:  Social History   Substance and Sexual Activity  Alcohol Use Yes   Comment: occ     Social History   Substance and Sexual Activity  Drug Use Yes   Types: Marijuana, Cocaine    Social History   Socioeconomic History   Marital status: Single    Spouse name: Not on file   Number of children: Not on file   Years of education: Not on file   Highest education level: Not on file  Occupational History   Not on file  Tobacco Use   Smoking status: Every Day    Packs/day: .5    Types: Cigarettes   Smokeless tobacco: Not on file  Substance and Sexual Activity   Alcohol use: Yes    Comment: occ   Drug use: Yes    Types: Marijuana, Cocaine   Sexual activity:  Not on file  Other Topics Concern   Not on file  Social History Narrative   ** Merged History Encounter **       Social Determinants of Health   Financial Resource Strain: Not on file  Food Insecurity: Food Insecurity Present (06/14/2022)   Hunger Vital Sign    Worried About Running Out of Food in the Last Year: Sometimes true    Ran Out of Food in the Last Year: Never true  Transportation Needs: Unmet Transportation Needs (06/14/2022)   PRAPARE - Administrator, Civil Service (Medical): Yes    Lack of Transportation (Non-Medical): Yes  Physical Activity: Not on file  Stress: Not on file  Social Connections: Not on file   Hospital Course: (Per admission evaluation notes): 42 year old AA male with hx of stimulant use disorder, alcohol use disorder & probably major depressive disorder. Admitted to the [facility] from the [facility] with complaint of intentional suicide attempt by overdose on 9-10 tablets of fluoxetine & 8 tablets of extra strength Tylenol. Patient apparently did attempt suicide on post mother's day Sunday night which was 3:00 am Monday morning. Reports indicated that the triggers were stressors from unemployment & child care. After ingestion of those medications, he started having nausea/vomiting & abdominal pain that led him calling 911 & was taken to the [facility] ED by the EMS two days after ingestion  of those medicines. After medical evaluation/stabilization, Avis was transferred to the [facility] for further psychiatric evaluation/treatment.   Prior to this discharge, Charles Cochran was seen & evaluated for mental health stability. The current laboratory findings were reviewed (stable), nurses notes & vital signs were reviewed as well. There are no current mental health or medical issues that should prevent this discharge at this time. Patient is being discharged to continue mental health care as noted below.   Although with what seem like an existing mental  health issues & polysubstance use disorders including serving times in jail/prison, this is Charles Cochran's first psychiatric admission/discharge summary from this [facility]. He was admitted to the [facility] with complaint of intentional suicide attempt by overdose on multiple tablets of Tylenol & Fluoxetine that belonged to his girlfriend. He did not seek medical assistance or called 911 until two days later after he developed N/V & intense abdominal pain. He was taken to the [facility] for evaluation. He was later transferred to the [facility] for further psychiatric evaluation/treatments.   After his arrival to to the [facility] adult unit & during his initial psychiatric evaluation, patient played very difficult to handle. He was uncooperative. He consistently argued that he was tricked into coming to the [facility]. He says he was not here to take medications, but only required a referral to an outpatient psychiatrist for follow on outpatient basis. He became agitated when he realized that he was involuntarily committed to be here. He became very loud, demanding to be discharged & was transferred to the [facility]. Up until his discharge date (today), patient had declined to take any medications. He presented with a significant elevated blood pressure that required treatment or monitoring. Again, Charles Cochran declined to take any antidepressant. Says he will rather wait till he gets discharged. After he was moved to [facility]-hall, patient became apologetic for his behavior & stated that the other patients told him that getting admitted to this place is an entrapment. He stated that he became fearful & suspicious after he was told as such.He stated that he thought he may never get out of this hospital.  While at the [facility], Charles Cochran was civil. He became very humble & polite & says he has had some time to reflect on his life & all the bad things he was doing to himself (drugging & drinking heavily) on daily basis. He says for a long time, he has  never had this much clarity in his mind, head & heart. He says he has been living his life in a fog. He said that he has realized that he could be happy after all without getting high. And as today is his 46st birthday, he stated that it will be his only birthday that he will celebrate sober. He stated that being in this hospital, he is able to realize that there are other places he could have put in applications for a job. Finally today, he declared that he was meant to be in this hospital to reflect on his life clear minded.  So, prior to discharge today, Jancarlos asked for his blood pressure medications. Says he was having anxiety symptoms last night & a nurse encouraged him to take hydroxyzine & he did & it helped him. He also asked for Trazodone upon discharge. He tolerated his treatment regimen without any adverse effects or reactions reported. Channon's symptoms has subsided. He is no longer agitated. He denies any symptoms of depression or anxiety. He denies any SIHI, AVH, delusional thoughts or paranoia.  He does not appear to be responding to any internal stimuli. He denies any substance withdrawal symptoms. He has met the maximum benefit of this hospitalization. He is also showing readiness for discharge. He is instructed to follow-up with his primary care provider to continue treatment/evaluation for his high blood pressure or go to the health department within his hometown.  During the course of his hospitalization, the 15-minute checks were adequate to ensure Asher's safety. Patient did not display any dangerous, violent or suicidal behavior on the unit. He interacted with patients & staff appropriately. He participated appropriately in the group sessions/therapies. His medications were addressed & adjusted to meet his needs. He was recommended for an outpatient follow-up care & medication management upon discharge to assure his continuity of care.  At the time of discharge, patient is not reporting  any acute suicidal/homicidal ideations. He feels more confident about his self & mental health care. He currently denies any new issues or concerns. Education and supportive counseling provided throughout her hospital stay & upon discharge.   Today upon his discharge evaluation with his treatment team, Ridhwan shares he is doing well. He denies any other specific concerns. He is sleeping well. His appetite is good. He denies other physical complaints. He denies AH/VH, delusional thoughts or paranoia. He does not appear to be responding to any internal stimuli. He denies any substance withdrawal symptoms. He denies abdominal pain or discomfort. He was able to engage in safety planning including plan to return to [facility] or contact emergency services if he feels unable to maintain his own safety or the safety of others. Pt had no further questions, comments, or concerns. He left [facility] with all personal belongings in no apparent distress. Transportation per his family (girlfriend).   Physical Findings: AIMS:  , ,  ,  ,    CIWA:  CIWA-Ar Total: 0 COWS:     Musculoskeletal: Strength & Muscle Tone: within normal limits Gait & Station: normal Patient leans: N/A   Psychiatric Specialty Exam:  Presentation  General Appearance:  Appropriate for Environment; Casual  Eye Contact: Good  Speech: Clear and Coherent; Normal Rate  Speech Volume: Normal  Handedness: Right  Mood and Affect  Mood: -- (Improving)  Affect: Appropriate; Congruent  Thought Process  Thought Processes: Coherent; Goal Directed; Linear  Descriptions of Associations:Intact  Orientation:Full (Time, Place and Person)  Thought Content:Logical  History of Schizophrenia/Schizoaffective disorder:No  Duration of Psychotic Symptoms: NA  Hallucinations:Hallucinations: None  Ideas of Reference:None  Suicidal Thoughts:Suicidal Thoughts: No  Homicidal Thoughts:Homicidal Thoughts: No  Sensorium  Memory: Immediate  Good; Recent Good; Remote Good  Judgment: Fair  Insight: Fair  Art therapist  Concentration: Good  Attention Span: Good  Recall: Good  Fund of Knowledge: Fair  Language: Good  Psychomotor Activity  Psychomotor Activity: Psychomotor Activity: Normal  Assets  Assets: Communication Skills; Desire for Improvement; Housing; Social Support; Resilience; Physical Health  Sleep  Sleep: Sleep: Good Number of Hours of Sleep: 7.5  Physical Exam: Physical Exam Vitals and nursing note reviewed.  HENT:     Head: Normocephalic.     Nose: Nose normal.     Mouth/Throat:     Pharynx: Oropharynx is clear.  Eyes:     Pupils: Pupils are equal, round, and reactive to light.  Cardiovascular:     Pulses: Normal pulses.     Comments: Blood pressure remains high: 133/92.  Patient declines to be on any medications including antihypertensive. Pulmonary:     Effort: Pulmonary effort  is normal.  Genitourinary:    Comments: Deferred Musculoskeletal:        General: Normal range of motion.     Cervical back: Normal range of motion.  Skin:    General: Skin is warm and dry.  Neurological:     General: No focal deficit present.     Mental Status: He is alert and oriented to person, place, and time. Mental status is at baseline.    Review of Systems  Constitutional:  Negative for chills, diaphoresis and fever.  HENT:  Negative for congestion and sore throat.   Respiratory:  Negative for cough, shortness of breath and wheezing.   Cardiovascular:  Negative for chest pain and palpitations.  Gastrointestinal:  Negative for abdominal pain, constipation, diarrhea, heartburn, nausea and vomiting.  Musculoskeletal:  Negative for joint pain and myalgias.  Skin:  Negative for itching and rash.  Neurological:  Negative for dizziness, tingling, tremors, sensory change, speech change, focal weakness, seizures, loss of consciousness, weakness and headaches.  Endo/Heme/Allergies:         Allergies: NKDA  Psychiatric/Behavioral:  Positive for substance abuse (Hx. cocaine, alcohol & THC use disorders.). Negative for depression, hallucinations, memory loss and suicidal ideas. The patient is not nervous/anxious and does not have insomnia.    Blood pressure (!) 138/98, pulse 64, temperature 97.8 F (36.6 C), temperature source Oral, resp. rate 16, height 6' (1.829 m), weight 64 kg, SpO2 100 %. Body mass index is 19.15 kg/m.  Social History   Tobacco Use  Smoking Status Every Day   Packs/day: .5   Types: Cigarettes  Smokeless Tobacco Not on file   Tobacco Cessation:  An FDA-approved tobacco cessation medication recommended at discharge  Blood Alcohol level:  Lab Results  Component Value Date   ETH <10 06/13/2022   ETH 278 (H) 01/08/2014   Metabolic Disorder Labs:  No results found for: "HGBA1C", "MPG" No results found for: "PROLACTIN" No results found for: "CHOL", "TRIG", "HDL", "CHOLHDL", "VLDL", "LDLCALC"  See Psychiatric Specialty Exam and Suicide Risk Assessment completed by Attending Physician prior to discharge.  Discharge destination:  Home  Is patient on multiple antipsychotic therapies at discharge:  No   Has Patient had three or more failed trials of antipsychotic monotherapy by history:  No  Recommended Plan for Multiple Antipsychotic Therapies: NA Discharge Instructions     Diet - low sodium heart healthy   Complete by: As directed       Allergies as of 06/17/2022   No Known Allergies      Medication List     STOP taking these medications    acetaminophen 325 MG tablet Commonly known as: Tylenol       TAKE these medications      Indication  hydrOXYzine 25 MG tablet Commonly known as: ATARAX Take 1 tablet (25 mg total) by mouth 3 (three) times daily as needed for anxiety.  Indication: Feeling Anxious   lisinopril 10 MG tablet Commonly known as: ZESTRIL Take 1 tablet (10 mg total) by mouth daily. For high blood pressure   Indication: High Blood Pressure Disorder   nicotine 14 mg/24hr patch Commonly known as: NICODERM CQ - dosed in mg/24 hours Place 1 patch (14 mg total) onto the skin daily. (May buy from over the counter): For smoking cessation. Start taking on: Jun 18, 2022  Indication: Nicotine Addiction   traZODone 50 MG tablet Commonly known as: DESYREL Take 1 tablet (50 mg total) by mouth at bedtime as needed for sleep.  Indication: Trouble Sleeping        Follow-up Information     [facility]. Go in 1 day(s).   Specialty: Urgent Care Why: this is a walk in clinic.  Please plan to arrive at 730a with your identification and discharge summary.  Clinic is open from 830a-3p Contact information: [address] [place] [address] [phone]               Follow-up recommendations: Activity:  As tolerated Diet: As recommended by your primary care doctor. Keep all scheduled follow-up appointments as recommended.Activity:  As tolerated Diet: As recommended by your primary care doctor. Keep all scheduled follow-up appointments as recommended.  Comments: Comments: Patient is recommended to follow-up care on an outpatient basis as noted above. Prescriptions sent to pt's pharmacy of choice at discharge.   Patient agreeable to plan.   Given opportunity to ask questions.   Appears to feel comfortable with discharge denies any current suicidal or homicidal thought. Patient is also instructed prior to discharge to: Take all medications as prescribed by his/her mental healthcare provider. Report any adverse effects and or reactions from the medicines to his/her outpatient provider promptly. Patient has been instructed & cautioned: To not engage in alcohol and or illegal drug use while on prescription medicines. In the event of worsening symptoms, patient is instructed to call the crisis hotline, 911 and or go to the nearest ED for appropriate evaluation and  treatment of symptoms. To follow-up with his/her primary care provider for your other medical issues, concerns and or health care needs.  Signed: Armandina Stammer, NP, pmhnp, fnp-bc 06/17/2022, 11:51 AM

## 2022-06-17 NOTE — Progress Notes (Signed)
   06/17/22 1052  Psych Admission Type (Psych Patients Only)  Admission Status Involuntary  Psychosocial Assessment  Patient Complaints None  Eye Contact Fair  Facial Expression Animated  Affect Appropriate to circumstance  Speech Logical/coherent  Interaction Assertive  Motor Activity Other (Comment) (WNL)  Appearance/Hygiene In scrubs  Behavior Characteristics Cooperative;Appropriate to situation  Mood Pleasant  Thought Process  Coherency WDL  Content WDL  Delusions None reported or observed  Perception WDL  Hallucination None reported or observed  Judgment WDL  Confusion None  Danger to Self  Current suicidal ideation? Denies  Agreement Not to Harm Self Yes  Description of Agreement verbal  Danger to Others  Danger to Others None reported or observed

## 2022-06-17 NOTE — [facility] Group Notes (Signed)
Adult Psychoeducational Group Note  Date:  06/17/2022 Time:  10:46 AM  Group Topic/Focus:  Goals Group:   The focus of this group is to help patients establish daily goals to achieve during treatment and discuss how the patient can incorporate goal setting into their daily lives to aide in recovery. Orientation:   The focus of this group is to educate the patient on the purpose and policies of crisis stabilization and provide a format to answer questions about their admission.  The group details unit policies and expectations of patients while admitted.  Participation Level:  Active  Participation Quality:  Appropriate  Affect:  Appropriate  Cognitive:  Appropriate  Insight: Appropriate  Engagement in Group:  Engaged  Modes of Intervention:  Discussion  Additional Comments:  Pt attended the goals/orientation group and remained appropriate and engaged throughout the duration of the group.   Sheran Lawless 06/17/2022, 10:46 AM

## 2022-06-17 NOTE — Progress Notes (Signed)
Patient discharged. Reviewed discharge instructions. Patient verbalized understanding. Patient received all personal belongings including sample medications and paper prescriptions. Patient left unit at 1200.

## 2022-06-17 NOTE — [facility] Suicide Risk Assessment (Signed)
Suicide Risk Assessment  Discharge Assessment    [facility] Discharge Suicide Risk Assessment   Principal Problem: MDD (major depressive disorder), recurrent severe, without psychosis (HCC)  Discharge Diagnoses: Principal Problem:   MDD (major depressive disorder), recurrent severe, without psychosis (HCC) Active Problems:   Stimulant use disorder   Alcohol use disorder  Total Time spent with patient:  Greater than 30 minutes  Musculoskeletal: Strength & Muscle Tone: within normal limits Gait & Station: normal Patient leans: N/A  Psychiatric Specialty Exam  Presentation  General Appearance:  Appropriate for Environment; Casual  Eye Contact: Good  Speech: Clear and Coherent; Normal Rate  Speech Volume: Normal  Handedness: Right   Mood and Affect  Mood: -- (Improving)  Duration of Depression Symptoms: Less than two weeks  Affect: Appropriate; Congruent  Thought Process  Thought Processes: Coherent; Goal Directed; Linear  Descriptions of Associations:Intact  Orientation:Full (Time, Place and Person)  Thought Content:Logical  History of Schizophrenia/Schizoaffective disorder:No  Duration of Psychotic Symptoms: Greater than 2 weeks. Hallucinations:Hallucinations: None  Ideas of Reference:None  Suicidal Thoughts:Suicidal Thoughts: No  Homicidal Thoughts:Homicidal Thoughts: No   Sensorium  Memory: Immediate Good; Recent Good; Remote Good  Judgment: Fair  Insight: Fair   Art therapist  Concentration: Good  Attention Span: Good  Recall: Good  Fund of Knowledge: Fair  Language: Good   Psychomotor Activity  Psychomotor Activity: Psychomotor Activity: Normal   Assets  Assets: Communication Skills; Desire for Improvement; Housing; Social Support; Resilience; Physical Health   Sleep  Sleep: Sleep: Good Number of Hours of Sleep: 7.5  Physical Exam: See H&P. Blood pressure (!) 133/92, pulse 68, temperature 97.8 F (36.6  C), temperature source Oral, resp. rate 16, height 6' (1.829 m), weight 64 kg, SpO2 100 %. Body mass index is 19.15 kg/m.  Mental Status Per Nursing Assessment::   On Admission:  Self-harm behaviors  Demographic Factors:  Male, Adolescent or young adult, Low socioeconomic status, and Unemployed  Loss Factors: Decrease in vocational status and Financial problems/change in socioeconomic status  Historical Factors: Impulsivity  Risk Reduction Factors:   Responsible for children under 77 years of age, Sense of responsibility to family, Religious beliefs about death, Living with another person, especially a relative, Positive social support, Positive therapeutic relationship, and Positive coping skills or problem solving skills  Continued Clinical Symptoms:  Alcohol/Substance Abuse/Dependencies  Cognitive Features That Contribute To Risk:  Closed-mindedness, Polarized thinking, and Thought constriction (tunnel vision)    Suicide Risk:  Minimal: No identifiable suicidal ideation.  Patients presenting with no risk factors but with morbid ruminations; may be classified as minimal risk based on the severity of the depressive symptoms   Plan Of Care/Follow-up recommendations:  The discharge recommendation above.  Armandina Stammer, NP, pmhnp, fnp-bc. 06/17/2022, 9:45 AM

## 2022-06-17 NOTE — Progress Notes (Signed)
  [facility] Adult Case Management Discharge Plan :  Will you be returning to the same living situation after discharge:  Yes,  with Girlfriend Ellan Lambert At discharge, do you have transportation home?: Yes,  girlfriend Ellan Lambert will transport Do you have the ability to pay for your medications: No.  Release of information consent forms completed and in the chart;  Patient's signature needed at discharge.  Patient to Follow up at:  Follow-up Information     [facility]. Go in 1 day(s).   Specialty: Urgent Care Why: this is a walk in clinic.  Please plan to arrive at 730a with your identification and discharge summary.  Clinic is open from 830a-3p Contact information: [address] [place] [address] [phone]                Next level of care provider has access to [facility] Link:yes  Safety Planning and Suicide Prevention discussed: Yes,  with patient     Has patient been referred to the Quitline?: Patient refused referral for treatment  Patient has been referred for addiction treatment: Patient refused referral for treatment.  Marinda Elk, LCSW 06/17/2022, 10:20 AM

## 2022-06-17 NOTE — [facility] Suicide Risk Assessment (Signed)
[facility] INPATIENT:  Family/Significant Other Suicide Prevention Education  Suicide Prevention Education:  Education Completed; Charles Cochran,  has been identified by the patient as the family member/significant other with whom the patient will be residing, and identified as the person(s) who will aid the patient in the event of a mental health crisis (suicidal ideations/suicide attempt).  With written consent from the patient, the family member/significant other has been provided the following suicide prevention education, prior to the and/or following the discharge of the patient.  The suicide prevention education provided includes the following: Suicide risk factors Suicide prevention and interventions National Suicide Hotline telephone number [facility] assessment telephone number [facility] Emergency Assistance 911 [facility] and/or Residential Mobile Crisis Unit telephone number  Request made of family/significant other to: Remove weapons (e.g., guns, rifles, knives), all items previously/currently identified as safety concern.   Remove drugs/medications (over-the-counter, prescriptions, illicit drugs), all items previously/currently identified as a safety concern.  The family member/significant other verbalizes understanding of the suicide prevention education information provided.  The family member/significant other agrees to remove the items of safety concern listed above.  Charles Cochran Charles Cochran 06/17/2022, 10:37 AM

## 2022-10-14 ENCOUNTER — Encounter (HOSPITAL_COMMUNITY): Payer: Self-pay | Admitting: Emergency Medicine

## 2022-10-14 ENCOUNTER — Other Ambulatory Visit: Payer: Self-pay

## 2022-10-14 ENCOUNTER — Emergency Department (HOSPITAL_COMMUNITY)
Admission: EM | Admit: 2022-10-14 | Discharge: 2022-10-14 | Discharge status: Against Medical Advice | Payer: Self-pay | Attending: Emergency Medicine | Admitting: Emergency Medicine

## 2022-10-14 DIAGNOSIS — Z5329 Procedure and treatment not carried out because of patient's decision for other reasons: Secondary | ICD-10-CM | POA: Insufficient documentation

## 2022-10-14 DIAGNOSIS — H5712 Ocular pain, left eye: Secondary | ICD-10-CM | POA: Insufficient documentation

## 2022-10-14 DIAGNOSIS — H579 Unspecified disorder of eye and adnexa: Secondary | ICD-10-CM

## 2022-10-14 NOTE — ED Notes (Signed)
Staff notified RN that patient left AMA / he refused staff to remove his IV .
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
## 2023-08-08 ENCOUNTER — Ambulatory Visit: Admitting: Nurse Practitioner
